# Patient Record
Sex: Male | Born: 1980 | Race: White | Hispanic: No | Marital: Single | Smoking: Current every day smoker
Health system: Southern US, Community
[De-identification: ages and names within clinical notes are randomized; demographics above are authoritative.]

---

## 2005-09-20 ENCOUNTER — Emergency Department: Payer: Self-pay | Admitting: Unknown Physician Specialty

## 2005-09-23 ENCOUNTER — Emergency Department: Payer: Self-pay | Admitting: Emergency Medicine

## 2010-05-26 ENCOUNTER — Emergency Department: Payer: Self-pay | Admitting: Emergency Medicine

## 2010-12-27 ENCOUNTER — Emergency Department: Payer: Self-pay | Admitting: Emergency Medicine

## 2011-05-03 ENCOUNTER — Emergency Department: Payer: Self-pay | Admitting: Emergency Medicine

## 2011-08-02 ENCOUNTER — Inpatient Hospital Stay: Payer: Self-pay | Admitting: Psychiatry

## 2011-08-02 LAB — DRUG SCREEN, URINE
Amphetamines, Ur Screen: NEGATIVE (ref ?–1000)
Barbiturates, Ur Screen: NEGATIVE (ref ?–200)
Cocaine Metabolite,Ur ~~LOC~~: POSITIVE (ref ?–300)
Methadone, Ur Screen: NEGATIVE (ref ?–300)
Opiate, Ur Screen: NEGATIVE (ref ?–300)
Tricyclic, Ur Screen: NEGATIVE (ref ?–1000)

## 2011-08-02 LAB — SALICYLATE LEVEL: Salicylates, Serum: 4.2 mg/dL — ABNORMAL HIGH

## 2011-08-02 LAB — URINALYSIS, COMPLETE
Ketone: NEGATIVE
Leukocyte Esterase: NEGATIVE
Nitrite: NEGATIVE
Ph: 6 (ref 4.5–8.0)
Protein: NEGATIVE
Specific Gravity: 1.002 (ref 1.003–1.030)

## 2011-08-02 LAB — COMPREHENSIVE METABOLIC PANEL
Alkaline Phosphatase: 116 U/L (ref 50–136)
Anion Gap: 13 (ref 7–16)
BUN: 6 mg/dL — ABNORMAL LOW (ref 7–18)
Bilirubin,Total: 0.4 mg/dL (ref 0.2–1.0)
Calcium, Total: 8.9 mg/dL (ref 8.5–10.1)
Chloride: 104 mmol/L (ref 98–107)
Co2: 27 mmol/L (ref 21–32)
Creatinine: 0.85 mg/dL (ref 0.60–1.30)
EGFR (Non-African Amer.): >60
Potassium: 4 mmol/L (ref 3.5–5.1)
SGPT (ALT): 47 U/L
Sodium: 144 mmol/L (ref 136–145)
Total Protein: 8.5 g/dL — ABNORMAL HIGH (ref 6.4–8.2)

## 2011-08-02 LAB — CBC
HGB: 17.4 g/dL (ref 13.0–18.0)
MCH: 31.3 pg (ref 26.0–34.0)
MCHC: 33.7 g/dL (ref 32.0–36.0)
MCV: 93 fL (ref 80–100)
Platelet: 294 10*3/uL (ref 150–440)

## 2011-08-02 LAB — ACETAMINOPHEN LEVEL: Acetaminophen: <2 ug/mL

## 2014-11-09 NOTE — H&P (Signed)
PATIENT NAME:  Gerald Ayala, Gerald Ayala#:  [id number] DATE OF BIRTH:  Feb 07, 1981  DATE OF ADMISSION:  08/02/2011  REFERRING PHYSICIAN: Dr. Daryel November ATTENDING PHYSICIAN: Jolanta B. Jennet Maduro, M.D.   IDENTIFYING DATA: Gerald Ayala is a 34 year old male with history of alcoholism.   CHIEF COMPLAINT: "I need to get better."   HISTORY OF PRESENT ILLNESS: Gerald Ayala was brought to the Emergency Room by his brother after he overdosed on Klonopin, alcohol and cocaine. He has been very adamant about wanting to die but allowed his brother to bring him to the hospital. He experienced several severe stressors lately, many members of his family passed away, some of his friends recently. He also has not been seeing his children. He lost his job, his health insurance and is unable to see a therapist or a psychiatrist any longer. He reports poor sleep, decreased appetite, anhedonia, feelings of guilt, worthlessness, hopelessness, poor energy and concentration, depressed mood, social isolation, and thought of suicide for the past several weeks. He has been drinking heavily lately. He has been using cocaine and other drugs. He denies psychotic symptoms or symptoms suggestive of bipolar mania.   PAST PSYCHIATRIC HISTORY: He had been in treatment taking antidepressant several months ago, however, when his co-pay was increased to $75 a visit he was no longer able to afford to see a psychiatrist. He continued with a therapist for a while but it also has stopped. He has no insurance at the moment so it may be more affordable for him to go to through mental health center channels. He denies prior suicide attempts. No prior hospitalization or substance abuse treatment. He has been drinking most of his life, since the age of 23. He drinks beer mostly. He will require alcohol detox.   FAMILY PSYCHIATRIC HISTORY: There is a long history of mental illness with bipolar, schizophrenia, depression, alcoholism. Two of his  uncles committed suicide by shooting themselves. There are other patients who were hospitalized or were rather unstable even if not diagnosed.    PAST MEDICAL HISTORY: None.   ALLERGIES: No known drug allergies.   MEDICATIONS ON ADMISSION: None.   SOCIAL HISTORY: He is separated from his family and unable to see his two children. He lost his job. He has very a supportive brother who really is more like a father to him as he grew up in an alcoholic father family. His younger brother died recently contributing to his depression.   REVIEW OF SYSTEMS: CONSTITUTIONAL: No fevers or chills. No weight changes. EYES: No double or blurred vision. ENT: No hearing loss. RESPIRATORY: No shortness of breath or cough. CARDIOVASCULAR: No chest pain or orthopnea. GASTROINTESTINAL: No abdominal pain, nausea, vomiting, or diarrhea. GENITOURINARY: No incontinence or frequency. ENDOCRINE: No heat or cold intolerance. LYMPHATIC: No anemia or easy bruising. INTEGUMENTARY: No acne or rash. MUSCULOSKELETAL: No muscle or joint pain. NEUROLOGIC: No tingling or weakness. PSYCHIATRIC: See history of present illness for details.   PHYSICAL EXAMINATION:  VITAL SIGNS: Blood pressure 112/75, pulse 78, respirations 20, temperature 98.4.   GENERAL: This is a well-developed male in no acute distress.   HEENT: The pupils are equal, round, and reactive to light. Sclera anicteric.   NECK: Supple. No thyromegaly.   LUNGS: Clear to auscultation. No dullness to percussion.   HEART: Regular rhythm and rate. No murmurs, rubs, or gallops.   ABDOMEN: Soft, nontender, nondistended. Positive bowel sounds.   MUSCULOSKELETAL: Normal muscle strength in all extremities.   SKIN: No rashes or bruises.  LYMPHATIC: No cervical adenopathy.   NEUROLOGIC: Cranial nerves II through XII are intact.   LABORATORY, DIAGNOSTIC AND RADIOLOGICAL DATA: Chemistries are within normal limits. Blood alcohol level on admission 0.197. LFTs within normal  limits except for AST of 41. TSH 3.17. Urine tox screen positive for cocaine and cannabinoids. CBC within normal limits except for white blood count of 14.8. Urinalysis is not suggestive of urinary tract infection. Serum acetaminophen less than 2. Serum salicylates 4.2. EKG normal sinus rhythm, left axis deviation, abnormal EKG.   MENTAL STATUS EXAMINATION: The patient is somnolent but arousable. He is oriented to person, place, time, and situation. He is pleasant, polite, and cooperative. He is poorly groomed, wearing hospital scrubs and a yellow shirt. He maintains some eye contact. His speech is slow and slightly slurred. His mood is depressed with flat affect. Thought processing is logical and goal oriented. Thought content: He denies suicidal or homicidal ideation but was admitted after a suicide attempt by overdose on alcohol, benzodiazepines and cocaine. He was very oversedated in the Emergency Room and we had to wait several hours for him to wake up. There are no delusions or paranoia. There are no auditory or visual hallucinations. His cognition is grossly intact. He registers three out of three and recalls two out of three objects after three minutes with help. He is unable to spell or count today. His abstraction is preserved. His insight and judgment are poor.   SUICIDE RISK ASSESSMENT ON ADMISSION: This is a patient with history of alcoholism and depression who has family history of multiple completed suicides who was admitted to the hospital after a serious suicide attempt.   ASSESSMENT:  AXIS I:  1. Mood disorder, not otherwise specified.  2. Alcohol dependence.  3. Benzodiazepine dependence.  4. Cocaine dependence.  5. Marijuana abuse.   AXIS II: Deferred.   AXIS III: None.   AXIS IV:  1. Mental illness.  2. Substance abuse.  3. Primary support.  4. Family conflict. 5. Employment. 6. Financial. 7. Access to care.   AXIS V: GAF on admission 25.   PLAN: The patient was  admitted to [facility] Behavioral Medicine unit for safety, stabilization and medication management. He was initially placed on suicide precautions and was closely monitored for any unsafe behaviors. He underwent full psychiatric and risk assessment. He received pharmacotherapy, individual and group psychotherapy, substance abuse counseling, and support from therapeutic milieu.  1. Suicidality. This has resolved. The patient is able to contract for safety. We will discontinue sitter.  2. Alcohol detox. The patient was placed on standard CIWA protocol and will monitor for symptoms of alcohol withdrawal.  3. Substance abuse treatment. The patient is uncertain whether or not he will be able to participate in residential rehab. He lives with a roommate and has to share the cost of housing and bills and needs to check with his family if they will help him while he is in treatment.  4. Disposition. To be established.  ____________________________ Ellin GoodieJolanta B. Jennet MaduroPucilowska, MD jbp:cms D: 08/03/2011 11:36:38 ET T: 08/03/2011 11:56:29 ET JOB#: [id number]  cc: Jolanta B. Jennet MaduroPucilowska, MD, <Dictator> Shari ProwsJOLANTA B PUCILOWSKA MD ELECTRONICALLY SIGNED 08/09/2011 23:33

## 2017-04-21 ENCOUNTER — Encounter: Payer: Self-pay | Admitting: Emergency Medicine

## 2017-04-21 ENCOUNTER — Emergency Department
Admission: EM | Admit: 2017-04-21 | Discharge: 2017-04-21 | Disposition: A | Discharge status: Home / Self Care | Payer: Self-pay | Attending: Emergency Medicine | Admitting: Emergency Medicine

## 2017-04-21 DIAGNOSIS — Y999 Unspecified external cause status: Secondary | ICD-10-CM | POA: Insufficient documentation

## 2017-04-21 DIAGNOSIS — S39012A Strain of muscle, fascia and tendon of lower back, initial encounter: Secondary | ICD-10-CM | POA: Insufficient documentation

## 2017-04-21 DIAGNOSIS — Y929 Unspecified place or not applicable: Secondary | ICD-10-CM | POA: Insufficient documentation

## 2017-04-21 DIAGNOSIS — M62830 Muscle spasm of back: Secondary | ICD-10-CM | POA: Insufficient documentation

## 2017-04-21 DIAGNOSIS — X58XXXA Exposure to other specified factors, initial encounter: Secondary | ICD-10-CM | POA: Insufficient documentation

## 2017-04-21 DIAGNOSIS — F17210 Nicotine dependence, cigarettes, uncomplicated: Secondary | ICD-10-CM | POA: Insufficient documentation

## 2017-04-21 DIAGNOSIS — M5431 Sciatica, right side: Secondary | ICD-10-CM | POA: Insufficient documentation

## 2017-04-21 DIAGNOSIS — Y939 Activity, unspecified: Secondary | ICD-10-CM | POA: Insufficient documentation

## 2017-04-21 MED ORDER — DICLOFENAC SODIUM 75 MG PO TBEC: 75.0000 mg | DELAYED_RELEASE_TABLET | Freq: Two times a day (BID) | ORAL | 0 refills | Status: AC

## 2017-04-21 MED ORDER — ORPHENADRINE CITRATE 30 MG/ML IJ SOLN
60.0000 mg | INTRAMUSCULAR | Status: AC
  Administered 2017-04-21: 60 mg via INTRAMUSCULAR
  Filled 2017-04-21: qty 2

## 2017-04-21 MED ORDER — KETOROLAC TROMETHAMINE 60 MG/2ML IM SOLN
30.0000 mg | Freq: Once | INTRAMUSCULAR | Status: AC
  Administered 2017-04-21: 30 mg via INTRAMUSCULAR
  Filled 2017-04-21: qty 2

## 2017-04-21 MED ORDER — CYCLOBENZAPRINE HCL 5 MG PO TABS: 5.0000 mg | ORAL_TABLET | Freq: Three times a day (TID) | ORAL | 0 refills | Status: DC | PRN

## 2017-04-21 NOTE — ED Triage Notes (Signed)
Pt reports right lower back pain that radiates down the back of his right leg; denies injury; taking Ibuprofen and Tylenol with no relief; pain worse with any movement

## 2017-04-21 NOTE — Discharge Instructions (Signed)
Your exam is consistent with a lumbar strain with some sciatic nerve irritation. Take the prescription meds as directed. Follow-up with [facility] for continued symptoms.

## 2017-04-22 NOTE — ED Provider Notes (Signed)
[facility] Emergency Department Provider Note ____________________________________________  Time seen: 2029  I have reviewed the triage vital signs and the nursing notes.  HISTORY  Chief Complaint  Back Pain  HPI Gerald Ayala is a 36 y.o. male Presents to the ED for evaluation of right low back pain with some radiation into the left buttocks and thigh. He describes symptoms began about 3-4 days prior. He denies any particular injury, accident, or,. He works as a Public affairs consultant, but denies any strenuous activities this week. He describes pain to the right lumbar sacral junction with referral into the buttocks and thigh. He denies any referral beyond the knee. He also denies any distal paresthesias, foot drop, or incontinence. He scratched pain is aggravated by transitioning from sit to stand. He denies any history of previous back injury, strain, or arthritis. He also denies any prior history of sciatic nerve irritation. He has taken Tylenol and ibuprofen in the past without any significant benefit.  History reviewed. No pertinent past medical history.  There are no active problems to display for this patient.  History reviewed. No pertinent surgical history.  Prior to Admission medications   Medication Sig Start Date End Date Taking? Authorizing Provider  cyclobenzaprine (FLEXERIL) 5 MG tablet Take 1 tablet (5 mg total) by mouth 3 (three) times daily as needed for muscle spasms. 04/21/17   Mishael Haran, Charlesetta Ivory, PA-C  diclofenac (VOLTAREN) 75 MG EC tablet Take 1 tablet (75 mg total) by mouth 2 (two) times daily. 04/21/17 05/06/17  Virlan Kempker, Charlesetta Ivory, PA-C    Allergies Patient has no known allergies.  History reviewed. No pertinent family history.  Social History Social History  Substance Use Topics  . Smoking status: Current Every Day Smoker    Packs/day: 0.50    Types: Cigarettes  . Smokeless tobacco: Never Used  . Alcohol  use Yes    Review of Systems  Constitutional: Negative for fever. Cardiovascular: Negative for chest pain. Respiratory: Negative for shortness of breath. Gastrointestinal: Negative for abdominal pain, vomiting and diarrhea. Genitourinary: Negative for dysuria. Musculoskeletal: Positive for back pain. Skin: Negative for rash. Neurological: Negative for headaches, focal weakness or numbness. ____________________________________________  PHYSICAL EXAM:  VITAL SIGNS: ED Triage Vitals  Enc Vitals Group     BP 04/21/17 1933 137/90     Pulse Rate 04/21/17 1933 84     Resp 04/21/17 1933 18     Temp 04/21/17 1933 98.3 F (36.8 C)     Temp Source 04/21/17 1933 Oral     SpO2 04/21/17 1933 98 %     Weight 04/21/17 1934 215 lb (97.5 kg)     Height 04/21/17 1934  (1.88 m)     Head Circumference --      Peak Flow --      Pain Score 04/21/17 1933 8     Pain Loc --      Pain Edu? --      Excl. in GC? --     Constitutional: Alert and oriented. Well appearing and in no distress. Head: Normocephalic and atraumatic. Eyes: Conjunctivae are normal. Normal extraocular movements Cardiovascular: Normal rate, regular rhythm. Normal distal pulses. Respiratory: Normal respiratory effort. No wheezes/rales/rhonchi. Gastrointestinal: Soft and nontender. No distention. Musculoskeletal: normal spinal alignment without midline tenderness, spasm, deformity, step-off. Patient with mild tenderness to palpation over the right lumbar sacral junction. He is able to transition from sit to stand without difficulty. Is able to demonstrate normal lumbar extension  but decreased lumbar flexion. He has the ability to raise the hips and legs bilaterally without difficulty. Negative supine straight leg raise. Nontender with normal range of motion in all extremities.  Neurologic:  Mildly antalgic gait without ataxia. Normal LE DTRs bilaterally. Normal toe dorsiflexion and foot eversion bilaterally. Normal speech and  language. No gross focal neurologic deficits are appreciated. Skin:  Skin is warm, dry and intact. No rash noted. ____________________________________________   RADIOLOGY  Deferred. ____________________________________________  PROCEDURES  Toradol 30 mg IM Norflex 60 mg IM ____________________________________________  INITIAL IMPRESSION / ASSESSMENT AND PLAN / ED COURSE  atient with the ED evaluation of acute lumbar strain with some mild right sciatic nerve irritation.Patient reports visiting improvement following injection medication administration in the ED. He is subsequently discharged with prescriptions for cyclobenzaprine and diclofenac. He should follow-up with [facility] or [facility] for ongoing symptom management. Return precautions are reviewed.  ____________________________________________  FINAL CLINICAL IMPRESSION(S) / ED DIAGNOSES  Final diagnoses:  Strain of lumbar region, initial encounter  Muscle spasm of back  Sciatica of right side      Karmen Stabs, Charlesetta Ivory, PA-C 04/22/17 0017    Myrna Blazer, MD 04/22/17 [phone]

## 2018-03-06 ENCOUNTER — Emergency Department
Admission: EM | Admit: 2018-03-06 | Discharge: 2018-03-06 | Disposition: A | Discharge status: Home / Self Care | Payer: Self-pay | Attending: Emergency Medicine | Admitting: Emergency Medicine

## 2018-03-06 ENCOUNTER — Encounter: Payer: Self-pay | Admitting: Emergency Medicine

## 2018-03-06 DIAGNOSIS — F17210 Nicotine dependence, cigarettes, uncomplicated: Secondary | ICD-10-CM | POA: Insufficient documentation

## 2018-03-06 DIAGNOSIS — Y93H3 Activity, building and construction: Secondary | ICD-10-CM | POA: Insufficient documentation

## 2018-03-06 DIAGNOSIS — M7021 Olecranon bursitis, right elbow: Secondary | ICD-10-CM | POA: Insufficient documentation

## 2018-03-06 MED ORDER — NAPROXEN 500 MG PO TABS: 500.0000 mg | ORAL_TABLET | Freq: Two times a day (BID) | ORAL | Status: DC

## 2018-03-06 MED ORDER — NAPROXEN 500 MG PO TABS
500.0000 mg | ORAL_TABLET | Freq: Once | ORAL | Status: AC
  Administered 2018-03-06: 500 mg via ORAL
  Filled 2018-03-06: qty 1

## 2018-03-06 NOTE — ED Provider Notes (Signed)
[facility] Emergency Department Provider Note   ____________________________________________   First MD Initiated Contact with Patient 03/06/18 1424     (approximate)  I have reviewed the triage vital signs and the nursing notes.   HISTORY  Chief Complaint Joint Swelling    HPI Gerald Ayala is a 37 y.o. male patient presents with 2 days of swelling and pain to right posterior elbow.  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  Patient rates his pain as a 6/10.  Patient described the pain is "aching".  Patient did pain increased with extension of the elbow.  No palliative measure for complaint.  History reviewed. No pertinent past medical history.  There are no active problems to display for this patient.   History reviewed. No pertinent surgical history.  Prior to Admission medications   Medication Sig Start Date End Date Taking? Authorizing Provider  cyclobenzaprine (FLEXERIL) 5 MG tablet Take 1 tablet (5 mg total) by mouth 3 (three) times daily as needed for muscle spasms. 04/21/17   Menshew, Charlesetta IvoryJenise V Bacon, PA-C  naproxen (NAPROSYN) 500 MG tablet Take 1 tablet (500 mg total) by mouth 2 (two) times daily with a meal. 03/06/18   Joni ReiningSmith, Ronald K, PA-C    Allergies Patient has no known allergies.  No family history on file.  Social History Social History   Tobacco Use  . Smoking status: Current Every Day Smoker    Packs/day: 0.50    Types: Cigarettes  . Smokeless tobacco: Never Used  Substance Use Topics  . Alcohol use: Yes  . Drug use: No    Review of Systems Constitutional: No fever/chills Eyes: No visual changes. ENT: No sore throat. Cardiovascular: Denies chest pain. Respiratory: Denies shortness of breath. Gastrointestinal: No abdominal pain.  No nausea, no vomiting.  No diarrhea.  No constipation. Genitourinary: Negative for dysuria. Musculoskeletal: Negative for back  pain. Skin: Edema posterior right elbow.  No erythema.   Neurological: Negative for headaches, focal weakness or numbness.   ____________________________________________   PHYSICAL EXAM:  VITAL SIGNS: ED Triage Vitals [03/06/18 1412]  Enc Vitals Group     BP (!) 141/73     Pulse Rate 90     Resp 20     Temp 99 F (37.2 C)     Temp Source Oral     SpO2 99 %     Weight 205 lb (93 kg)     Height 6\' 2"  (1.88 m)     Head Circumference      Peak Flow      Pain Score 6     Pain Loc      Pain Edu?      Excl. in GC?    Constitutional: Alert and oriented. Well appearing and in no acute distress.  Afebrile. Cardiovascular: Normal rate, regular rhythm. Grossly normal heart sounds.  Good peripheral circulation. Respiratory: Normal respiratory effort.  No retractions. Lungs CTAB. Gastrointestinal: Soft and nontender. No distention. No abdominal bruits. No CVA tenderness. Musculoskeletal: No obvious deformity to right elbow.   Neurologic:  Normal speech and language. No gross focal neurologic deficits are appreciated. No gait instability. Skin:  Skin is warm, dry and intact. No rash noted.  Edema without erythema. Psychiatric: Mood and affect are normal. Speech and behavior are normal.  ____________________________________________   LABS (all labs ordered are listed, but only abnormal results are displayed)  Labs Reviewed - No data to display ____________________________________________  EKG  ____________________________________________  RADIOLOGY  ED MD interpretation:    Official radiology report(s): No results found.  ____________________________________________   PROCEDURES  Procedure(s) performed: None  .Joint Aspiration/Arthrocentesis Date/Time: 03/06/2018 2:54 PM Performed by: Joni ReiningSmith, Ronald K, PA-C Authorized by: Joni ReiningSmith, Ronald K, PA-C   Consent:    Consent obtained:  Verbal   Consent given by:  Patient   Risks discussed:  Bleeding, infection, pain and  incomplete drainage Location:    Location:  Elbow   Elbow:  R elbow Anesthesia (see MAR for exact dosages):    Anesthesia method:  Local infiltration   Local anesthetic:  Lidocaine 1% WITH epi Procedure details:    Preparation: Patient was prepped and draped in usual sterile fashion     Needle gauge:  18 G   Ultrasound guidance: no     Approach:  Inferior   Aspirate characteristics:  Yellow   Steroid injected: no     Specimen collected: no   Post-procedure details:    Dressing:  Sterile dressing   Patient tolerance of procedure:  Tolerated well, no immediate complications    Critical Care performed: No  ____________________________________________   INITIAL IMPRESSION / ASSESSMENT AND PLAN / ED COURSE  As part of my medical decision making, I reviewed the following data within the electronic MEDICAL RECORD NUMBER    Edema posterior left elbow secondary to olecranon bursitis.  Patient gave verbal consent to have the lesion aspirated.  Patient given discharge care instruction in a prescription for naproxen.  Patient return back in 3 days for reevaluation.  Advised patient return back earlier if increased swelling or pain.      ____________________________________________   FINAL CLINICAL IMPRESSION(S) / ED DIAGNOSES  Final diagnoses:  Olecranon bursitis of right elbow     ED Discharge Orders         Ordered    naproxen (NAPROSYN) 500 MG tablet  2 times daily with meals     03/06/18 1445           Note:  This document was prepared using Dragon voice recognition software and may include unintentional dictation errors.    Joni ReiningSmith, Ronald K, PA-C 03/06/18 1459    Minna AntisPaduchowski, Kevin, MD 03/06/18 [phone]

## 2018-03-06 NOTE — Discharge Instructions (Signed)
Follow discharge care instructions and take medication as directed.  Return back in 3 days for reevaluation.

## 2018-03-06 NOTE — ED Triage Notes (Signed)
Pt reports last 2 days has had swelling to right elbow and pain. Denies obvious injuries.

## 2018-04-15 ENCOUNTER — Emergency Department: Payer: Self-pay

## 2018-04-15 ENCOUNTER — Other Ambulatory Visit: Payer: Self-pay

## 2018-04-15 ENCOUNTER — Emergency Department
Admission: EM | Admit: 2018-04-15 | Discharge: 2018-04-15 | Disposition: A | Discharge status: Home / Self Care | Payer: Self-pay | Attending: Emergency Medicine | Admitting: Emergency Medicine

## 2018-04-15 DIAGNOSIS — S01511A Laceration without foreign body of lip, initial encounter: Secondary | ICD-10-CM | POA: Insufficient documentation

## 2018-04-15 DIAGNOSIS — W500XXA Accidental hit or strike by another person, initial encounter: Secondary | ICD-10-CM | POA: Insufficient documentation

## 2018-04-15 DIAGNOSIS — Y998 Other external cause status: Secondary | ICD-10-CM | POA: Insufficient documentation

## 2018-04-15 DIAGNOSIS — Y929 Unspecified place or not applicable: Secondary | ICD-10-CM | POA: Insufficient documentation

## 2018-04-15 DIAGNOSIS — F17210 Nicotine dependence, cigarettes, uncomplicated: Secondary | ICD-10-CM | POA: Insufficient documentation

## 2018-04-15 DIAGNOSIS — Y9389 Activity, other specified: Secondary | ICD-10-CM | POA: Insufficient documentation

## 2018-04-15 DIAGNOSIS — Z79899 Other long term (current) drug therapy: Secondary | ICD-10-CM | POA: Insufficient documentation

## 2018-04-15 MED ORDER — LIDOCAINE HCL (PF) 1 % IJ SOLN
INTRAMUSCULAR | Status: AC
  Filled 2018-04-15: qty 5

## 2018-04-15 MED ORDER — LIDOCAINE HCL 1 % IJ SOLN: 5.0000 mL | Freq: Once | INTRAMUSCULAR | Status: DC

## 2018-04-15 MED ORDER — MELOXICAM 15 MG PO TABS: 15.0000 mg | ORAL_TABLET | Freq: Every day | ORAL | 1 refills | Status: AC

## 2018-04-15 MED ORDER — CEPHALEXIN 500 MG PO CAPS: 500.0000 mg | ORAL_CAPSULE | Freq: Three times a day (TID) | ORAL | 0 refills | Status: AC

## 2018-04-15 NOTE — ED Notes (Signed)
Pt offered to have his blood  Cleaned
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
## 2018-04-15 NOTE — ED Notes (Signed)
Patient transported to CT 

## 2019-05-16 ENCOUNTER — Emergency Department
Admission: EM | Admit: 2019-05-16 | Discharge: 2019-05-16 | Disposition: A | Discharge status: Home / Self Care | Payer: Self-pay | Attending: Emergency Medicine | Admitting: Emergency Medicine

## 2019-05-16 ENCOUNTER — Other Ambulatory Visit: Payer: Self-pay

## 2019-05-16 ENCOUNTER — Emergency Department: Payer: Self-pay

## 2019-05-16 ENCOUNTER — Encounter: Payer: Self-pay | Admitting: Emergency Medicine

## 2019-05-16 DIAGNOSIS — F17210 Nicotine dependence, cigarettes, uncomplicated: Secondary | ICD-10-CM | POA: Insufficient documentation

## 2019-05-16 DIAGNOSIS — R079 Chest pain, unspecified: Secondary | ICD-10-CM

## 2019-05-16 LAB — CBC
HCT: 46.1 % (ref 39.0–52.0)
Hemoglobin: 15.7 g/dL (ref 13.0–17.0)
MCH: 31.6 pg (ref 26.0–34.0)
MCHC: 34.1 g/dL (ref 30.0–36.0)
MCV: 92.8 fL (ref 80.0–100.0)
Platelets: 326 10*3/uL (ref 150–400)
RBC: 4.97 MIL/uL (ref 4.22–5.81)
RDW: 12.6 % (ref 11.5–15.5)
WBC: 20.4 10*3/uL — ABNORMAL HIGH (ref 4.0–10.5)
nRBC: 0 % (ref 0.0–0.2)

## 2019-05-16 LAB — BASIC METABOLIC PANEL
Anion gap: 13 (ref 5–15)
BUN: 12 mg/dL (ref 6–20)
CO2: 22 mmol/L (ref 22–32)
Calcium: 9.6 mg/dL (ref 8.9–10.3)
Chloride: 103 mmol/L (ref 98–111)
Creatinine, Ser: 0.76 mg/dL (ref 0.61–1.24)
GFR calc Af Amer: >60 mL/min (ref >60)
GFR calc non Af Amer: >60 mL/min (ref >60)
Glucose, Bld: 111 mg/dL — ABNORMAL HIGH (ref 70–99)
Potassium: 4.3 mmol/L (ref 3.5–5.1)
Sodium: 138 mmol/L (ref 135–145)

## 2019-05-16 LAB — TROPONIN I (HIGH SENSITIVITY)
Troponin I (High Sensitivity): 4 ng/L (ref <18)
Troponin I (High Sensitivity): 5 ng/L (ref <18)

## 2019-05-16 MED ORDER — DIAZEPAM 5 MG PO TABS: 5.0000 mg | ORAL_TABLET | Freq: Three times a day (TID) | ORAL | 0 refills | Status: DC | PRN

## 2019-05-16 MED ORDER — IOHEXOL 350 MG/ML SOLN
100.0000 mL | Freq: Once | INTRAVENOUS | Status: AC | PRN
  Administered 2019-05-16: 100 mL via INTRAVENOUS

## 2019-05-16 MED ORDER — FAMOTIDINE 20 MG PO TABS: 20.0000 mg | ORAL_TABLET | Freq: Two times a day (BID) | ORAL | 1 refills | Status: AC

## 2019-05-16 MED ORDER — HYDROMORPHONE HCL 1 MG/ML IJ SOLN
1.0000 mg | Freq: Once | INTRAMUSCULAR | Status: AC
  Administered 2019-05-16: 1 mg via INTRAVENOUS
  Filled 2019-05-16: qty 1

## 2019-05-16 MED ORDER — SODIUM CHLORIDE 0.9% FLUSH: 3.0000 mL | Freq: Once | INTRAVENOUS | Status: DC

## 2019-05-16 NOTE — ED Notes (Signed)

## 2019-05-16 NOTE — ED Provider Notes (Signed)
[facility] Emergency Department Provider Note       Time seen: ----------------------------------------- 11:47 AM on 05/16/2019 -----------------------------------------   I have reviewed the triage vital signs and the nursing notes.  HISTORY   Chief Complaint Chest Pain    HPI Gerald Ayala is a 38 y.o. male with no known past medical history who presents to the ED for bilateral shoulder pain that radiates to the center of his chest and into his back.  Patient states his brother died of a cardiac event at the age of 61.  He has had some sweats, denies nausea, vomiting, fever.  He has recently had a cough.  Pain is 8 out of 10 in his chest  History reviewed. No pertinent past medical history.  There are no active problems to display for this patient.   History reviewed. No pertinent surgical history.  Allergies Patient has no known allergies.  Social History Social History   Tobacco Use  . Smoking status: Current Every Day Smoker    Packs/day: 1.00    Types: Cigarettes  . Smokeless tobacco: Never Used  Substance Use Topics  . Alcohol use: Yes  . Drug use: No    Review of Systems Constitutional: Negative for fever. Cardiovascular: Positive for chest pain Respiratory: Negative for shortness of breath. Gastrointestinal: Negative for abdominal pain, vomiting and diarrhea. Musculoskeletal: Positive for back pain Skin: Negative for rash. Neurological: Negative for headaches, focal weakness or numbness.  All systems negative/normal/unremarkable except as stated in the HPI  ____________________________________________   PHYSICAL EXAM:  VITAL SIGNS: ED Triage Vitals  Enc Vitals Group     BP 05/16/19 1014 137/84     Pulse Rate 05/16/19 1014 91     Resp 05/16/19 1014 16     Temp 05/16/19 1014 98.7 F (37.1 C)     Temp Source 05/16/19 1014 Oral     SpO2 05/16/19 1014 100 %     Weight 05/16/19 1015 204 lb (92.5 kg)     Height  05/16/19 1015 6\' 3"  (1.905 m)     Head Circumference --      Peak Flow --      Pain Score 05/16/19 1015 8     Pain Loc --      Pain Edu? --      Excl. in GC? --    Constitutional: Alert and oriented. Well appearing and in no distress. Eyes: Conjunctivae are normal. Normal extraocular movements. ENT      Head: Normocephalic and atraumatic.      Nose: No congestion/rhinnorhea.      Mouth/Throat: Mucous membranes are moist.      Neck: No stridor. Cardiovascular: Normal rate, regular rhythm. No murmurs, rubs, or gallops. Respiratory: Normal respiratory effort without tachypnea nor retractions. Breath sounds are clear and equal bilaterally. No wheezes/rales/rhonchi. Gastrointestinal: Soft and nontender. Normal bowel sounds Musculoskeletal: Nontender with normal range of motion in extremities. No lower extremity tenderness nor edema. Neurologic:  Normal speech and language. No gross focal neurologic deficits are appreciated.  Skin:  Skin is warm, dry and intact. No rash noted. Psychiatric: Mood and affect are normal. Speech and behavior are normal.  ____________________________________________  EKG: Interpreted by me.  Sinus rhythm with a rate of 97 bpm, normal PR interval, normal QRS, normal QT  Repeat EKG interpreted by me, sinus rhythm the rate of 79 bpm, low voltage, ST elevation minimally noted, particularly in the inferior leads.  No reciprocal changes are noted  ____________________________________________  ED COURSE:  As part of my medical decision making, I reviewed the following data within the [place] History obtained from family if available, nursing notes, old chart and ekg, as well as notes from prior ED visits. Patient presented for chest pain, we will assess with labs and imaging as indicated at this time.   Procedures  Therisa Doyne Granillo was evaluated in Emergency Department on 05/16/2019 for the symptoms described in the history of present illness.  He was evaluated in the context of the global COVID-19 pandemic, which necessitated consideration that the patient might be at risk for infection with the SARS-CoV-2 virus that causes COVID-19. Institutional protocols and algorithms that pertain to the evaluation of patients at risk for COVID-19 are in a state of rapid change based on information released by regulatory bodies including the CDC and federal and state organizations. These policies and algorithms were followed during the patient's care in the ED.  ____________________________________________   LABS (pertinent positives/negatives)  Labs Reviewed  BASIC METABOLIC PANEL - Abnormal; Notable for the following components:      Result Value   Glucose, Bld 111 (*)    All other components within normal limits  CBC - Abnormal; Notable for the following components:   WBC 20.4 (*)    All other components within normal limits  TROPONIN I (HIGH SENSITIVITY)  TROPONIN I (HIGH SENSITIVITY)    RADIOLOGY Images were viewed by me  Chest x-ray is unremarkable CTA dissection protocol IMPRESSION:  CT angiogram chest:   1. No thoracic aortic aneurysm or dissection.   2. No evident pulmonary embolus.   3. Mild bibasilar atelectasis. No parenchymal lung edema or  consolidation.   4. No evident thoracic adenopathy.   CT angiogram abdomen; CT angiogram pelvis:   1. Aorta, major mesenteric, and major pelvic arterial vessels are  patent without evidence of obstructive plaque. No aneurysm or  dissection involving these vessels. No evident fibromuscular  dysplasia.   2. Hepatic steatosis.   3. No bowel obstruction. No abscess in the abdomen or pelvis.  Appendix appears normal.   4. No renal or ureteral calculus on either side. No hydronephrosis.  Urinary bladder wall thickness normal.   5. Apparent lipoma in the right quadratus lumborum muscle.  ____________________________________________   DIFFERENTIAL DIAGNOSIS    Musculoskeletal pain, GERD, anxiety, MI, PE, dissection  FINAL ASSESSMENT AND PLAN  Chest pain, leukocytosis   Plan: The patient had presented for nonspecific chest pain. Patient's labs did indicate leukocytosis which may be chronic. Patient's imaging initially was negative on chest x-ray and CT angiogram dissection protocol was unremarkable.  No clear etiology for his symptoms.  He will be discharged with close outpatient follow-up.   Laurence Aly, MD    Note: This note was generated in part or whole with voice recognition software. Voice recognition is usually quite accurate but there are transcription errors that can and very often do occur. I apologize for any typographical errors that were not detected and corrected.     Earleen Newport, MD 05/16/19 [phone]
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
## 2019-07-01 ENCOUNTER — Emergency Department
Admission: EM | Admit: 2019-07-01 | Discharge: 2019-07-01 | Disposition: A | Discharge status: Home / Self Care | Payer: Self-pay | Attending: Emergency Medicine | Admitting: Emergency Medicine

## 2019-07-01 ENCOUNTER — Other Ambulatory Visit: Payer: Self-pay

## 2019-07-01 ENCOUNTER — Encounter: Payer: Self-pay | Admitting: Emergency Medicine

## 2019-07-01 ENCOUNTER — Emergency Department: Payer: Self-pay

## 2019-07-01 DIAGNOSIS — W500XXA Accidental hit or strike by another person, initial encounter: Secondary | ICD-10-CM | POA: Insufficient documentation

## 2019-07-01 DIAGNOSIS — S2231XA Fracture of one rib, right side, initial encounter for closed fracture: Secondary | ICD-10-CM | POA: Insufficient documentation

## 2019-07-01 DIAGNOSIS — Y9372 Activity, wrestling: Secondary | ICD-10-CM | POA: Insufficient documentation

## 2019-07-01 DIAGNOSIS — Y929 Unspecified place or not applicable: Secondary | ICD-10-CM | POA: Insufficient documentation

## 2019-07-01 DIAGNOSIS — F17210 Nicotine dependence, cigarettes, uncomplicated: Secondary | ICD-10-CM | POA: Insufficient documentation

## 2019-07-01 DIAGNOSIS — Y999 Unspecified external cause status: Secondary | ICD-10-CM | POA: Insufficient documentation

## 2019-07-01 LAB — CBC
HCT: 42.8 % (ref 39.0–52.0)
Hemoglobin: 14.4 g/dL (ref 13.0–17.0)
MCH: 31.4 pg (ref 26.0–34.0)
MCHC: 33.6 g/dL (ref 30.0–36.0)
MCV: 93.2 fL (ref 80.0–100.0)
Platelets: 321 10*3/uL (ref 150–400)
RBC: 4.59 MIL/uL (ref 4.22–5.81)
RDW: 12.3 % (ref 11.5–15.5)
WBC: 14.5 10*3/uL — ABNORMAL HIGH (ref 4.0–10.5)
nRBC: 0 % (ref 0.0–0.2)

## 2019-07-01 LAB — COMPREHENSIVE METABOLIC PANEL
ALT: 25 U/L (ref 0–44)
AST: 28 U/L (ref 15–41)
Albumin: 4.1 g/dL (ref 3.5–5.0)
Alkaline Phosphatase: 72 U/L (ref 38–126)
Anion gap: 10 (ref 5–15)
BUN: 10 mg/dL (ref 6–20)
CO2: 25 mmol/L (ref 22–32)
Calcium: 9 mg/dL (ref 8.9–10.3)
Chloride: 105 mmol/L (ref 98–111)
Creatinine, Ser: 0.66 mg/dL (ref 0.61–1.24)
GFR calc Af Amer: >60 mL/min (ref >60)
GFR calc non Af Amer: >60 mL/min (ref >60)
Glucose, Bld: 106 mg/dL — ABNORMAL HIGH (ref 70–99)
Potassium: 3.9 mmol/L (ref 3.5–5.1)
Sodium: 140 mmol/L (ref 135–145)
Total Bilirubin: 0.9 mg/dL (ref 0.3–1.2)
Total Protein: 7.4 g/dL (ref 6.5–8.1)

## 2019-07-01 MED ORDER — OXYCODONE-ACETAMINOPHEN 5-325 MG PO TABS: 1.0000 | ORAL_TABLET | ORAL | 0 refills | Status: DC | PRN

## 2019-07-01 MED ORDER — KETOROLAC TROMETHAMINE 30 MG/ML IJ SOLN
30.0000 mg | Freq: Once | INTRAMUSCULAR | Status: AC
  Administered 2019-07-01: 30 mg via INTRAMUSCULAR
  Filled 2019-07-01: qty 1

## 2019-07-01 MED ORDER — NAPROXEN 500 MG PO TABS: 500.0000 mg | ORAL_TABLET | Freq: Two times a day (BID) | ORAL | 2 refills | Status: DC

## 2019-07-01 MED ORDER — OXYCODONE-ACETAMINOPHEN 5-325 MG PO TABS
2.0000 | ORAL_TABLET | Freq: Once | ORAL | Status: AC
  Administered 2019-07-01: 2 via ORAL
  Filled 2019-07-01: qty 2

## 2019-07-01 NOTE — ED Notes (Signed)
See triage note  Presents with pain to right posterior rib area  States he developed pain after getting kneed by son while wrestling

## 2019-07-01 NOTE — ED Notes (Signed)
Pt taken to xray 
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
## 2019-07-01 NOTE — ED Notes (Signed)
Pt back from x-ray.
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
## 2020-03-08 ENCOUNTER — Emergency Department
Admission: EM | Admit: 2020-03-08 | Discharge: 2020-03-08 | Disposition: A | Discharge status: Home / Self Care | Payer: Self-pay | Attending: Emergency Medicine | Admitting: Emergency Medicine

## 2020-03-08 ENCOUNTER — Other Ambulatory Visit: Payer: Self-pay

## 2020-03-08 ENCOUNTER — Emergency Department: Payer: Self-pay

## 2020-03-08 DIAGNOSIS — F17210 Nicotine dependence, cigarettes, uncomplicated: Secondary | ICD-10-CM | POA: Insufficient documentation

## 2020-03-08 DIAGNOSIS — M545 Low back pain: Secondary | ICD-10-CM | POA: Insufficient documentation

## 2020-03-08 DIAGNOSIS — M549 Dorsalgia, unspecified: Secondary | ICD-10-CM

## 2020-03-08 DIAGNOSIS — M62830 Muscle spasm of back: Secondary | ICD-10-CM | POA: Insufficient documentation

## 2020-03-08 LAB — URINALYSIS, COMPLETE (UACMP) WITH MICROSCOPIC
Bacteria, UA: NONE SEEN
Bilirubin Urine: NEGATIVE
Glucose, UA: NEGATIVE mg/dL
Ketones, ur: NEGATIVE mg/dL
Nitrite: NEGATIVE
Protein, ur: NEGATIVE mg/dL
Specific Gravity, Urine: 1.017 (ref 1.005–1.030)
Squamous Epithelial / HPF: NONE SEEN (ref 0–5)
pH: 5 (ref 5.0–8.0)

## 2020-03-08 LAB — COMPREHENSIVE METABOLIC PANEL
ALT: 23 U/L (ref 0–44)
AST: 22 U/L (ref 15–41)
Albumin: 4.3 g/dL (ref 3.5–5.0)
Alkaline Phosphatase: 77 U/L (ref 38–126)
Anion gap: 13 (ref 5–15)
BUN: 9 mg/dL (ref 6–20)
CO2: 25 mmol/L (ref 22–32)
Calcium: 9.2 mg/dL (ref 8.9–10.3)
Chloride: 101 mmol/L (ref 98–111)
Creatinine, Ser: 0.69 mg/dL (ref 0.61–1.24)
GFR calc Af Amer: >60 mL/min (ref >60)
GFR calc non Af Amer: >60 mL/min (ref >60)
Glucose, Bld: 90 mg/dL (ref 70–99)
Potassium: 3.4 mmol/L — ABNORMAL LOW (ref 3.5–5.1)
Sodium: 139 mmol/L (ref 135–145)
Total Bilirubin: 0.9 mg/dL (ref 0.3–1.2)
Total Protein: 7.5 g/dL (ref 6.5–8.1)

## 2020-03-08 LAB — CBC
HCT: 42.5 % (ref 39.0–52.0)
Hemoglobin: 14.4 g/dL (ref 13.0–17.0)
MCH: 31.6 pg (ref 26.0–34.0)
MCHC: 33.9 g/dL (ref 30.0–36.0)
MCV: 93.4 fL (ref 80.0–100.0)
Platelets: 301 10*3/uL (ref 150–400)
RBC: 4.55 MIL/uL (ref 4.22–5.81)
RDW: 12.7 % (ref 11.5–15.5)
WBC: 13.2 10*3/uL — ABNORMAL HIGH (ref 4.0–10.5)
nRBC: 0 % (ref 0.0–0.2)

## 2020-03-08 LAB — CHLAMYDIA/NGC RT PCR (ARMC ONLY)
Chlamydia Tr: NOT DETECTED
N gonorrhoeae: NOT DETECTED

## 2020-03-08 MED ORDER — LIDOCAINE 5 % EX PTCH: 1.0000 | MEDICATED_PATCH | CUTANEOUS | 0 refills | Status: DC

## 2020-03-08 MED ORDER — LIDOCAINE 5 % EX PTCH
1.0000 | MEDICATED_PATCH | CUTANEOUS | Status: DC
  Administered 2020-03-08: 1 via TRANSDERMAL
  Filled 2020-03-08: qty 1

## 2020-03-08 MED ORDER — ORPHENADRINE CITRATE 30 MG/ML IJ SOLN
60.0000 mg | Freq: Two times a day (BID) | INTRAMUSCULAR | Status: DC
  Administered 2020-03-08: 60 mg via INTRAVENOUS
  Filled 2020-03-08: qty 2

## 2020-03-08 MED ORDER — KETOROLAC TROMETHAMINE 30 MG/ML IJ SOLN
30.0000 mg | Freq: Once | INTRAMUSCULAR | Status: AC
  Administered 2020-03-08: 30 mg via INTRAVENOUS
  Filled 2020-03-08: qty 1

## 2020-03-08 MED ORDER — OXYCODONE-ACETAMINOPHEN 5-325 MG PO TABS
1.0000 | ORAL_TABLET | ORAL | Status: DC | PRN
  Administered 2020-03-08: 1 via ORAL
  Filled 2020-03-08: qty 1

## 2020-03-08 MED ORDER — OXYCODONE-ACETAMINOPHEN 5-325 MG PO TABS
1.0000 | ORAL_TABLET | Freq: Four times a day (QID) | ORAL | 0 refills | Status: AC | PRN
Start: 2020-03-08 — End: 2020-03-11

## 2020-03-08 MED ORDER — MORPHINE SULFATE (PF) 4 MG/ML IV SOLN
4.0000 mg | Freq: Once | INTRAVENOUS | Status: AC
  Administered 2020-03-08: 4 mg via INTRAVENOUS
  Filled 2020-03-08: qty 1

## 2020-03-08 MED ORDER — IBUPROFEN 600 MG PO TABS: 600.0000 mg | ORAL_TABLET | Freq: Four times a day (QID) | ORAL | 0 refills | Status: DC | PRN

## 2020-03-08 MED ORDER — CYCLOBENZAPRINE HCL 5 MG PO TABS: ORAL_TABLET | ORAL | 0 refills | Status: DC

## 2020-03-08 NOTE — ED Provider Notes (Signed)
[facility] Emergency Department Provider Note  ____________________________________________  Time seen: Approximately 3:24 PM  I have reviewed the triage vital signs and the nursing notes.   HISTORY  Chief Complaint Back Pain    HPI Gerald Ayala is a 39 y.o. male that presents to the emergency department for evaluation of right mid back pain for 2 days.  Pain improves when he lays down and is worse when he sits up.  He is having difficulty standing up due to the pain. Once he starts walking, he does ok walking. He is not having any pain over his spine. Patient denies any trauma.  He has history of sciatica and states that this feels similar. He has a history of a rib fracture to the right side.  He has never had a kidney stone.  No fevers.  No bowel or bladder dysfunction or saddle anesthesias. No IV drug use. No nausea, vomiting, abdominal pain, diarrhea, constipation, urinary symptoms.   History reviewed. No pertinent past medical history.  There are no problems to display for this patient.   History reviewed. No pertinent surgical history.  Prior to Admission medications   Medication Sig Start Date End Date Taking? Authorizing Provider  cyclobenzaprine (FLEXERIL) 5 MG tablet Take 1-2 tablets 3 times daily as needed 03/08/20   Enid Derry, PA-C  famotidine (PEPCID) 20 MG tablet Take 1 tablet (20 mg total) by mouth 2 (two) times daily. 05/16/19   Emily Filbert, MD  ibuprofen (ADVIL) 600 MG tablet Take 1 tablet (600 mg total) by mouth every 6 (six) hours as needed. 03/08/20   Enid Derry, PA-C  lidocaine (LIDODERM) 5 % Place 1 patch onto the skin daily. Remove & Discard patch within 12 hours or as directed by MD 03/08/20   Enid Derry, PA-C  naproxen (NAPROSYN) 500 MG tablet Take 1 tablet (500 mg total) by mouth 2 (two) times daily with a meal. 07/01/19   Jene Every, MD  oxyCODONE-acetaminophen (PERCOCET) 5-325 MG tablet Take 1 tablet by  mouth every 6 (six) hours as needed for up to 3 days for severe pain. 03/08/20 03/11/20  Enid Derry, PA-C    Allergies Patient has no known allergies.  History reviewed. No pertinent family history.  Social History Social History   Tobacco Use  . Smoking status: Current Every Day Smoker    Packs/day: 1.00    Types: Cigarettes  . Smokeless tobacco: Never Used  Substance Use Topics  . Alcohol use: Yes  . Drug use: No     Review of Systems  Constitutional: No fever/chills Cardiovascular: No chest pain. Respiratory:  No SOB. Gastrointestinal: No abdominal pain.  No nausea, no vomiting.  Genitourinary: Negative for dysuria. Musculoskeletal: Positive for back pain. Skin: Negative for rash, abrasions, lacerations, ecchymosis. Neurological: Negative for headaches   ____________________________________________   PHYSICAL EXAM:  VITAL SIGNS: ED Triage Vitals  Enc Vitals Group     BP 03/08/20 1129 103/67     Pulse Rate 03/08/20 1129 69     Resp 03/08/20 1129 18     Temp 03/08/20 1129 98.2 F (36.8 C)     Temp Source 03/08/20 1129 Oral     SpO2 03/08/20 1129 96 %     Weight 03/08/20 1127 205 lb (93 kg)     Height 03/08/20 1127 6\' 2"  (1.88 m)     Head Circumference --      Peak Flow --      Pain Score 03/08/20 1127 10  Pain Loc --      Pain Edu? --      Excl. in GC? --      Constitutional: Alert and oriented. Well appearing and in no acute distress. Eyes: Conjunctivae are normal. PERRL. EOMI. Head: Atraumatic. ENT:      Ears:      Nose: No congestion/rhinnorhea.      Mouth/Throat: Mucous membranes are moist.  Neck: No stridor.  Cardiovascular: Normal rate, regular rhythm.  Good peripheral circulation. Respiratory: Normal respiratory effort without tachypnea or retractions. Lungs CTAB. Good air entry to the bases with no decreased or absent breath sounds. Gastrointestinal: Bowel sounds 4 quadrants. Soft and nontender to palpation. No guarding or rigidity.  No palpable masses. No distention. Musculoskeletal: Full range of motion to all extremities. No gross deformities appreciated.  Tenderness to palpation to right mid back. No tenderness to palpation to thoracic spine or lumbar spine. Strength equal in lower extremities bilaterally. Antalgic gait. Neurologic:  Normal speech and language. No gross focal neurologic deficits are appreciated.  Skin:  Skin is warm, dry and intact. No rash noted. Psychiatric: Mood and affect are normal. Speech and behavior are normal. Patient exhibits appropriate insight and judgement.   ____________________________________________   LABS (all labs ordered are listed, but only abnormal results are displayed)  Labs Reviewed  CBC - Abnormal; Notable for the following components:      Result Value   WBC 13.2 (*)    All other components within normal limits  COMPREHENSIVE METABOLIC PANEL - Abnormal; Notable for the following components:   Potassium 3.4 (*)    All other components within normal limits  URINALYSIS, COMPLETE (UACMP) WITH MICROSCOPIC - Abnormal; Notable for the following components:   Color, Urine YELLOW (*)    APPearance CLEAR (*)    Hgb urine dipstick SMALL (*)    Leukocytes,Ua SMALL (*)    All other components within normal limits  CHLAMYDIA/NGC RT PCR ([facility] ONLY)  URINE CULTURE   ____________________________________________  EKG   ____________________________________________  RADIOLOGY Lexine Baton, personally viewed and evaluated these images (plain radiographs) as part of my medical decision making, as well as reviewing the written report by the radiologist.  Ultrasound  CT Renal Stone Study  Result Date: 03/08/2020 CLINICAL DATA:  Flank pain, kidney stone suspected. EXAM: CT ABDOMEN AND PELVIS WITHOUT CONTRAST TECHNIQUE: Multidetector CT imaging of the abdomen and pelvis was performed following the standard protocol without IV contrast. COMPARISON:  05/16/2019 FINDINGS:  Lower chest: Lung bases are clear. Hepatobiliary: No focal, suspicious hepatic lesion. Stable presumed cyst in the RIGHT hepatic lobe along the margin of the RIGHT hemi liver. Sludge in the gallbladder. No biliary duct distension on noncontrast imaging. Pancreas: Pancreas is unremarkable without ductal dilation or inflammation. Spleen: Spleen normal in size and contour. Adrenals/Urinary Tract: Adrenal glands are normal. Renal contours are smooth. Urinary bladder is under distended, grossly unremarkable. No hydronephrosis or nephrolithiasis.  No ureteral calculi. Stomach/Bowel: Stomach under distended limiting assessment. No acute small bowel process. Normal appendix. No pericolonic stranding. Vascular/Lymphatic: Aorta is of normal caliber. No adenopathy in the retroperitoneum. No adenopathy in the upper abdomen. No pelvic lymphadenopathy. Reproductive: Prostate unremarkable by CT. Other: No ascites. Musculoskeletal: No acute musculoskeletal process. Spinal degenerative changes. Incompletely united RIGHT eleventh rib fracture seen on the study of July 01, 2019. IMPRESSION: No acute findings in the abdomen or pelvis. Specifically, no hydronephrosis or nephrolithiasis. Incompletely united, previously identified RIGHT eleventh rib fracture. Electronically Signed   By: Juliene Pina  Wile M.D.   On: 03/08/2020 14:24    ____________________________________________    PROCEDURES  Procedure(s) performed:    Procedures    Medications  morphine 4 MG/ML injection 4 mg (4 mg Intravenous Given 03/08/20 1341)  ketorolac (TORADOL) 30 MG/ML injection 30 mg (30 mg Intravenous Given 03/08/20 1528)     ____________________________________________   INITIAL IMPRESSION / ASSESSMENT AND PLAN / ED COURSE  Pertinent labs & imaging results that were available during my care of the patient were reviewed by me and considered in my medical decision making (see chart for details).  Review of the [place] CSRS was performed  in accordance of the [facility] prior to dispensing any controlled drugs.   Patient presented to the emergency department for evaluation of right mid back pain for 2 day. Vital signs and exam are reassuring. There is hemoglobin, few WBCs and leukocytes without bacteria on urinalysis and will be sent for culture. Low suspicion for UTI or pyelonephrosis as patient denies any urinary symptoms.Gonorrhea and chlamydia are negative.  No nephrolithiasis on CT scan. Symptoms are consistent with musculoskeletal pain and likely muscle spasm. Pain may be related to his previous rib fracture. Patient was given morphine, percocet, toradol and norflex in the ER for pain.  Patient will be discharged home with prescriptions for mortrin, flexeril, lidoderm, and percocet. Patient is to follow up with PCP as directed. Patient is given ED precautions to return to the ED for any worsening or new symptoms.  Gerald Ayala was evaluated in Emergency Department on 03/09/2020 for the symptoms described in the history of present illness. He was evaluated in the context of the global COVID-19 pandemic, which necessitated consideration that the patient might be at risk for infection with the SARS-CoV-2 virus that causes COVID-19. Institutional protocols and algorithms that pertain to the evaluation of patients at risk for COVID-19 are in a state of rapid change based on information released by regulatory bodies including the CDC and federal and state organizations. These policies and algorithms were followed during the patient's care in the ED.   ____________________________________________  FINAL CLINICAL IMPRESSION(S) / ED DIAGNOSES  Final diagnoses:  Other acute back pain  Muscle spasm of back      NEW MEDICATIONS STARTED DURING THIS VISIT:  ED Discharge Orders         Ordered    ibuprofen (ADVIL) 600 MG tablet  Every 6 hours PRN        03/08/20 1558    cyclobenzaprine (FLEXERIL) 5 MG tablet        03/08/20 1558     lidocaine (LIDODERM) 5 %  Every 24 hours        03/08/20 1558    oxyCODONE-acetaminophen (PERCOCET) 5-325 MG tablet  Every 6 hours PRN        03/08/20 1558              This chart was dictated using voice recognition software/Dragon. Despite best efforts to proofread, errors can occur which can change the meaning. Any change was purely unintentional.    Enid Derry, PA-C 03/09/20 5573    Jene Every, MD 03/11/20 [phone]

## 2020-03-08 NOTE — ED Triage Notes (Signed)
Pt comes POV with right sided back pain. Hx of pinched sciatic nerve and reports feels similar. 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

## 2020-03-08 NOTE — ED Notes (Signed)
Reference triage note. Pt able to ambulate but only while holding onto wheelchair. Pt appears to be in pain during ambulation.

## 2020-03-09 LAB — URINE CULTURE: Culture: <10000 — AB

## 2020-04-05 ENCOUNTER — Emergency Department
Admission: EM | Admit: 2020-04-05 | Discharge: 2020-04-05 | Disposition: A | Discharge status: Home / Self Care | Payer: Self-pay | Attending: Emergency Medicine | Admitting: Emergency Medicine

## 2020-04-05 DIAGNOSIS — R21 Rash and other nonspecific skin eruption: Secondary | ICD-10-CM | POA: Insufficient documentation

## 2020-04-05 DIAGNOSIS — Y9289 Other specified places as the place of occurrence of the external cause: Secondary | ICD-10-CM | POA: Insufficient documentation

## 2020-04-05 DIAGNOSIS — F17210 Nicotine dependence, cigarettes, uncomplicated: Secondary | ICD-10-CM | POA: Insufficient documentation

## 2020-04-05 DIAGNOSIS — W5911XA Bitten by nonvenomous snake, initial encounter: Secondary | ICD-10-CM

## 2020-04-05 DIAGNOSIS — Z23 Encounter for immunization: Secondary | ICD-10-CM | POA: Insufficient documentation

## 2020-04-05 DIAGNOSIS — T63091A Toxic effect of venom of other snake, accidental (unintentional), initial encounter: Secondary | ICD-10-CM | POA: Insufficient documentation

## 2020-04-05 DIAGNOSIS — M2550 Pain in unspecified joint: Secondary | ICD-10-CM | POA: Insufficient documentation

## 2020-04-05 DIAGNOSIS — Y93K1 Activity, walking an animal: Secondary | ICD-10-CM | POA: Insufficient documentation

## 2020-04-05 LAB — CBC
HCT: 44.7 % (ref 39.0–52.0)
Hemoglobin: 15.6 g/dL (ref 13.0–17.0)
MCH: 31.9 pg (ref 26.0–34.0)
MCHC: 34.9 g/dL (ref 30.0–36.0)
MCV: 91.4 fL (ref 80.0–100.0)
Platelets: 319 10*3/uL (ref 150–400)
RBC: 4.89 MIL/uL (ref 4.22–5.81)
RDW: 12.2 % (ref 11.5–15.5)
WBC: 19.2 10*3/uL — ABNORMAL HIGH (ref 4.0–10.5)
nRBC: 0 % (ref 0.0–0.2)

## 2020-04-05 LAB — COMPREHENSIVE METABOLIC PANEL
ALT: 26 U/L (ref 0–44)
AST: 26 U/L (ref 15–41)
Albumin: 4.4 g/dL (ref 3.5–5.0)
Alkaline Phosphatase: 85 U/L (ref 38–126)
Anion gap: 10 (ref 5–15)
BUN: 10 mg/dL (ref 6–20)
CO2: 26 mmol/L (ref 22–32)
Calcium: 9.1 mg/dL (ref 8.9–10.3)
Chloride: 102 mmol/L (ref 98–111)
Creatinine, Ser: 0.72 mg/dL (ref 0.61–1.24)
GFR calc Af Amer: >60 mL/min (ref >60)
GFR calc non Af Amer: >60 mL/min (ref >60)
Glucose, Bld: 100 mg/dL — ABNORMAL HIGH (ref 70–99)
Potassium: 4.3 mmol/L (ref 3.5–5.1)
Sodium: 138 mmol/L (ref 135–145)
Total Bilirubin: 0.7 mg/dL (ref 0.3–1.2)
Total Protein: 7.7 g/dL (ref 6.5–8.1)

## 2020-04-05 LAB — FIBRINOGEN: Fibrinogen: 288 mg/dL (ref 210–475)

## 2020-04-05 MED ORDER — OXYCODONE-ACETAMINOPHEN 5-325 MG PO TABS
1.0000 | ORAL_TABLET | Freq: Four times a day (QID) | ORAL | 0 refills | Status: DC | PRN
Start: 2020-04-05 — End: 2020-10-19

## 2020-04-05 MED ORDER — FENTANYL CITRATE (PF) 100 MCG/2ML IJ SOLN
50.0000 ug | Freq: Once | INTRAMUSCULAR | Status: AC
  Administered 2020-04-05: 50 ug via INTRAVENOUS
  Filled 2020-04-05: qty 2

## 2020-04-05 MED ORDER — OXYCODONE-ACETAMINOPHEN 5-325 MG PO TABS
1.0000 | ORAL_TABLET | ORAL | Status: DC | PRN
  Administered 2020-04-05: 1 via ORAL
  Filled 2020-04-05: qty 1

## 2020-04-05 MED ORDER — OXYCODONE-ACETAMINOPHEN 5-325 MG PO TABS
2.0000 | ORAL_TABLET | Freq: Once | ORAL | Status: AC
  Administered 2020-04-05: 2 via ORAL
  Filled 2020-04-05: qty 2

## 2020-04-05 MED ORDER — TETANUS-DIPHTH-ACELL PERTUSSIS 5-2.5-18.5 LF-MCG/0.5 IM SUSP
0.5000 mL | Freq: Once | INTRAMUSCULAR | Status: AC
  Administered 2020-04-05: 0.5 mL via INTRAMUSCULAR
  Filled 2020-04-05: qty 0.5

## 2020-04-05 MED ORDER — DOXYCYCLINE HYCLATE 100 MG PO CAPS: 100.0000 mg | ORAL_CAPSULE | Freq: Two times a day (BID) | ORAL | 0 refills | Status: AC

## 2020-04-05 NOTE — ED Provider Notes (Signed)
[facility] Emergency Department Provider Note  ____________________________________________   First MD Initiated Contact with Patient 04/05/20 1506     (approximate)  I have reviewed the triage vital signs and the nursing notes.   HISTORY  Chief Complaint Snake Bite    HPI Gerald Ayala is a 39 y.o. male  Here with snake bite, swelling to R foot. Pt was outside walking dog yesterday when he felt a sharp stabbing pain along his medial R foot. He reports he had some swelling at the time and pain. He had been drinking and was able to rest. Today, he showed it to his fiance and mother who told him to come for evaluation. Reports + aching, throbbing, severe pain. Thankfully, the swelling and discoloration has not worsened today. No alleviating factrs. No perioral numbness, CP, SOB, other complaints.        History reviewed. No pertinent past medical history.  There are no problems to display for this patient.   History reviewed. No pertinent surgical history.  Prior to Admission medications   Medication Sig Start Date End Date Taking? Authorizing Provider  cyclobenzaprine (FLEXERIL) 5 MG tablet Take 1-2 tablets 3 times daily as needed 03/08/20   Enid Derry, PA-C  doxycycline (VIBRAMYCIN) 100 MG capsule Take 1 capsule (100 mg total) by mouth 2 (two) times daily for 7 days. 04/05/20 04/12/20  Shaune Pollack, MD  famotidine (PEPCID) 20 MG tablet Take 1 tablet (20 mg total) by mouth 2 (two) times daily. 05/16/19   Emily Filbert, MD  ibuprofen (ADVIL) 600 MG tablet Take 1 tablet (600 mg total) by mouth every 6 (six) hours as needed. 03/08/20   Enid Derry, PA-C  lidocaine (LIDODERM) 5 % Place 1 patch onto the skin daily. Remove & Discard patch within 12 hours or as directed by MD 03/08/20   Enid Derry, PA-C  naproxen (NAPROSYN) 500 MG tablet Take 1 tablet (500 mg total) by mouth 2 (two) times daily with a meal. 07/01/19   Jene Every, MD    oxyCODONE-acetaminophen (PERCOCET) 5-325 MG tablet Take 1-2 tablets by mouth every 6 (six) hours as needed for moderate pain or severe pain. 04/05/20 04/05/21  Shaune Pollack, MD    Allergies Patient has no known allergies.  No family history on file.  Social History Social History   Tobacco Use  . Smoking status: Current Every Day Smoker    Packs/day: 1.00    Types: Cigarettes  . Smokeless tobacco: Never Used  Substance Use Topics  . Alcohol use: Yes  . Drug use: No    Review of Systems  Review of Systems  Constitutional: Negative for chills, fatigue and fever.  HENT: Negative for sore throat.   Respiratory: Negative for shortness of breath.   Cardiovascular: Negative for chest pain.  Gastrointestinal: Negative for abdominal pain.  Genitourinary: Negative for flank pain.  Musculoskeletal: Positive for arthralgias and gait problem. Negative for neck pain.  Skin: Positive for rash and wound.  Allergic/Immunologic: Negative for immunocompromised state.  Neurological: Negative for weakness and numbness.  Hematological: Does not bruise/bleed easily.  All other systems reviewed and are negative.    ____________________________________________  PHYSICAL EXAM:      VITAL SIGNS: ED Triage Vitals  Enc Vitals Group     BP 04/05/20 0937 (!) 135/94     Pulse Rate 04/05/20 0937 (!) 121     Resp 04/05/20 0937 20     Temp --      Temp src --  SpO2 04/05/20 0937 99 %     Weight 04/05/20 0939 210 lb (95.3 kg)     Height 04/05/20 0939 6\' 2"  (1.88 m)     Head Circumference --      Peak Flow --      Pain Score 04/05/20 0939 7     Pain Loc --      Pain Edu? --      Excl. in GC? --      Physical Exam Vitals and nursing note reviewed.  Constitutional:      General: He is not in acute distress.    Appearance: He is well-developed.  HENT:     Head: Normocephalic and atraumatic.  Eyes:     Conjunctiva/sclera: Conjunctivae normal.  Cardiovascular:     Rate and Rhythm:  Normal rate and regular rhythm.     Heart sounds: Normal heart sounds.  Pulmonary:     Effort: Pulmonary effort is normal. No respiratory distress.     Breath sounds: No wheezing.  Abdominal:     General: There is no distension.  Musculoskeletal:     Cervical back: Neck supple.  Skin:    General: Skin is warm.     Capillary Refill: Capillary refill takes less than 2 seconds.     Findings: No rash.  Neurological:     Mental Status: He is alert and oriented to person, place, and time.     Motor: No abnormal muscle tone.      LOWER EXTREMITY EXAM: RIGHT  INSPECTION & PALPATION: Moderate ecchymoses and swelling along medial foot. No blistering. Two small puncture marks approx 1 cm apart. No ulcerations.  SENSORY: sensation is intact to light touch in:  Superficial peroneal nerve distribution (over dorsum of foot) Deep peroneal nerve distribution (over first dorsal web space) Sural nerve distribution (over lateral aspect 5th metatarsal) Saphenous nerve distribution (over medial instep)  MOTOR:  + Motor EHL (great toe dorsiflexion) + FHL (great toe plantar flexion)  + TA (ankle dorsiflexion)  + GSC (ankle plantar flexion)  VASCULAR: 2+ dorsalis pedis and posterior tibialis pulses Capillary refill < 2 sec, toes warm and well-perfused  COMPARTMENTS: Soft, warm, well-perfused No pain with passive extension No parethesias   ____________________________________________   LABS (all labs ordered are listed, but only abnormal results are displayed)  Labs Reviewed  CBC - Abnormal; Notable for the following components:      Result Value   WBC 19.2 (*)    All other components within normal limits  COMPREHENSIVE METABOLIC PANEL - Abnormal; Notable for the following components:   Glucose, Bld 100 (*)    All other components within normal limits  FIBRINOGEN    ____________________________________________  EKG:  None ________________________________________  RADIOLOGY All imaging, including plain films, CT scans, and ultrasounds, independently reviewed by me, and interpretations confirmed via formal radiology reads.  ED MD interpretation:   None  Official radiology report(s): No results found.  ____________________________________________  PROCEDURES   Procedure(s) performed (including Critical Care):  Procedures  ____________________________________________  INITIAL IMPRESSION / MDM / ASSESSMENT AND PLAN / ED COURSE  As part of my medical decision making, I reviewed the following data within the electronic MEDICAL RECORD NUMBER Nursing notes reviewed and incorporated, Old chart reviewed, Notes from prior ED visits, and [place] Controlled Substance Database       *Gerald Ayala was evaluated in Emergency Department on 04/05/2020 for the symptoms described in the history of present illness. He was evaluated in the context of  the global COVID-19 pandemic, which necessitated consideration that the patient might be at risk for infection with the SARS-CoV-2 virus that causes COVID-19. Institutional protocols and algorithms that pertain to the evaluation of patients at risk for COVID-19 are in a state of rapid change based on information released by regulatory bodies including the CDC and federal and state organizations. These policies and algorithms were followed during the patient's care in the ED.  Some ED evaluations and interventions may be delayed as a result of limited staffing during the pandemic.*     Medical Decision Making:  39 yo M here with snake bite.  Bite was last night at around 10 PM.  On exam, he has moderate ecchymosis and swelling but no ulceration, no proximal extension of swelling, no signs of complications.  His pain is severe, but controlled with agents here in the ED.  He is outside of the timeframe and does not meet criteria for antivenom based on improvement in swelling and  no systemic symptoms.  He is hemodynamically stable.  Lab work is reassuring.  He has mild, likely reactive leukocytosis but normal CMP and fibrinogen.  Will DC with analgesics, brief course of antibiotics given open wound from dirty VAC, and refer for outpatient follow-up.  Return precautions given.  ____________________________________________  FINAL CLINICAL IMPRESSION(S) / ED DIAGNOSES  Final diagnoses:  Snake bite, initial encounter     MEDICATIONS GIVEN DURING THIS VISIT:  Medications  oxyCODONE-acetaminophen (PERCOCET/ROXICET) 5-325 MG per tablet 2 tablet (has no administration in time range)  Tdap (BOOSTRIX) injection 0.5 mL (has no administration in time range)  fentaNYL (SUBLIMAZE) injection 50 mcg (50 mcg Intravenous Given 04/05/20 1340)     ED Discharge Orders         Ordered    oxyCODONE-acetaminophen (PERCOCET) 5-325 MG tablet  Every 6 hours PRN        04/05/20 1527    doxycycline (VIBRAMYCIN) 100 MG capsule  2 times daily        04/05/20 1527           Note:  This document was prepared using Dragon voice recognition software and may include unintentional dictation errors.   Shaune Pollack, MD 04/05/20 [phone]

## 2020-04-05 NOTE — ED Notes (Signed)
Addendum: Patient with swelling to top of the foot as well.

## 2020-04-05 NOTE — ED Triage Notes (Signed)
Patient to ED for snake bite to the medial right foot. Patient with swelling, one puncture wound, and slight bruising to the area of the puncture. Patient states it is quite painful. Patient unable to bear weight on foot without increased amount of pain. States it happened sometime last night around 10pm but didn't think too much about it until his mother, a nurse, looked at it and told him it was a snake bite

## 2020-10-19 ENCOUNTER — Other Ambulatory Visit: Payer: Self-pay

## 2020-10-19 ENCOUNTER — Emergency Department
Admission: EM | Admit: 2020-10-19 | Discharge: 2020-10-19 | Disposition: A | Discharge status: Home / Self Care | Payer: Medicaid Other | Attending: Emergency Medicine | Admitting: Emergency Medicine

## 2020-10-19 ENCOUNTER — Emergency Department: Payer: Medicaid Other

## 2020-10-19 DIAGNOSIS — M79672 Pain in left foot: Secondary | ICD-10-CM | POA: Diagnosis present

## 2020-10-19 DIAGNOSIS — F17210 Nicotine dependence, cigarettes, uncomplicated: Secondary | ICD-10-CM | POA: Insufficient documentation

## 2020-10-19 DIAGNOSIS — W11XXXA Fall on and from ladder, initial encounter: Secondary | ICD-10-CM | POA: Insufficient documentation

## 2020-10-19 DIAGNOSIS — M7732 Calcaneal spur, left foot: Secondary | ICD-10-CM | POA: Insufficient documentation

## 2020-10-19 MED ORDER — NAPROXEN 500 MG PO TABS: 500.0000 mg | ORAL_TABLET | Freq: Two times a day (BID) | ORAL | 0 refills | Status: DC

## 2020-10-19 MED ORDER — OXYCODONE-ACETAMINOPHEN 7.5-325 MG PO TABS: 1.0000 | ORAL_TABLET | Freq: Four times a day (QID) | ORAL | 0 refills | Status: DC | PRN

## 2020-10-19 NOTE — Discharge Instructions (Signed)
Follow discharge care instruction.  Advised to purchase over-the-counter heel spur inserts

## 2020-10-19 NOTE — ED Provider Notes (Signed)
[facility] Emergency Department Provider Note   ____________________________________________   None    (approximate)  I have reviewed the triage vital signs and the nursing notes.   HISTORY  Chief Complaint Foot Pain    HPI Gerald Ayala is a 40 y.o. male patient complaint of posterior left heel pain secondary to fall from a ladder 4 days ago.  Patient said he slipped from a ladder and landed on his left heel.  Patient states that since then pain increased with ambulation and standing.  Denies loss of sensation or function.  Rates pain as a 6/10.  Described pain as "achy".  No palliative measure for complaint.         History reviewed. No pertinent past medical history.  There are no problems to display for this patient.   History reviewed. No pertinent surgical history.  Prior to Admission medications   Medication Sig Start Date End Date Taking? Authorizing Provider  naproxen (NAPROSYN) 500 MG tablet Take 1 tablet (500 mg total) by mouth 2 (two) times daily with a meal. 10/19/20  Yes Joni Reining, PA-C  oxyCODONE-acetaminophen (PERCOCET) 7.5-325 MG tablet Take 1 tablet by mouth every 6 (six) hours as needed for severe pain. 10/19/20  Yes Joni Reining, PA-C  famotidine (PEPCID) 20 MG tablet Take 1 tablet (20 mg total) by mouth 2 (two) times daily. 05/16/19   Emily Filbert, MD  ibuprofen (ADVIL) 600 MG tablet Take 1 tablet (600 mg total) by mouth every 6 (six) hours as needed. 03/08/20   Enid Derry, PA-C    Allergies Patient has no known allergies.  No family history on file.  Social History Social History   Tobacco Use  . Smoking status: Current Every Day Smoker    Packs/day: 1.00    Types: Cigarettes  . Smokeless tobacco: Never Used  Substance Use Topics  . Alcohol use: Yes    Comment: occ  . Drug use: Yes    Types: Marijuana    Comment: occ    Review of Systems Constitutional: No fever/chills Eyes: No visual  changes. ENT: No sore throat. Cardiovascular: Denies chest pain. Respiratory: Denies shortness of breath. Gastrointestinal: No abdominal pain.  No nausea, no vomiting.  No diarrhea.  No constipation. Genitourinary: Negative for dysuria. Musculoskeletal: Left heel pain. Skin: Negative for rash. Neurological: Negative for headaches, focal weakness or numbness.   ____________________________________________   PHYSICAL EXAM:  VITAL SIGNS: ED Triage Vitals  Enc Vitals Group     BP 10/19/20 0915 138/83     Pulse Rate 10/19/20 0915 85     Resp 10/19/20 0915 17     Temp 10/19/20 0915 98.1 F (36.7 C)     Temp Source 10/19/20 0915 Oral     SpO2 10/19/20 0915 98 %     Weight 10/19/20 0917 205 lb (93 kg)     Height 10/19/20 0917 6\' 2"  (1.88 m)     Head Circumference --      Peak Flow --      Pain Score 10/19/20 0917 6     Pain Loc --      Pain Edu? --      Excl. in GC? --    Constitutional: Alert and oriented. Well appearing and in no acute distress. Cardiovascular: Normal rate, regular rhythm. Grossly normal heart sounds.  Good peripheral circulation. Respiratory: Normal respiratory effort.  No retractions. Lungs CTAB. Musculoskeletal: No obvious deformity to the left foot/ankle.  Patient is  moderate guarding palpation to the plantar aspect of the left heel. Neurologic:  Normal speech and language. No gross focal neurologic deficits are appreciated. No gait instability. Skin:  Skin is warm, dry and intact. No rash noted. Psychiatric: Mood and affect are normal. Speech and behavior are normal.  ____________________________________________   LABS (all labs ordered are listed, but only abnormal results are displayed)  Labs Reviewed - No data to display ____________________________________________  EKG   ____________________________________________  RADIOLOGY I, Joni Reining, personally viewed and evaluated these images (plain radiographs) as part of my medical decision  making, as well as reviewing the written report by the radiologist.  ED MD interpretation:    Official radiology report(s): DG Ankle Complete Left  Result Date: 10/19/2020 CLINICAL DATA:  Pain following fall EXAM: LEFT ANKLE COMPLETE - 3+ VIEW COMPARISON:  None. FINDINGS: Frontal, oblique, and lateral views were obtained. No fracture or joint effusion. There is no appreciable joint space narrowing or erosion. There are small inferior and posterior calcaneal spurs. Ankle mortise appears intact. IMPRESSION: No evident fracture or appreciable arthropathy. Ankle mortise appears intact. There are calcaneal spurs. Electronically Signed   By: Bretta Bang III M.D.   On: 10/19/2020 10:52    ____________________________________________   PROCEDURES  Procedure(s) performed (including Critical Care):  Procedures   ____________________________________________   INITIAL IMPRESSION / ASSESSMENT AND PLAN / ED COURSE  As part of my medical decision making, I reviewed the following data within the electronic MEDICAL RECORD NUMBER         Patient presents with posterior heel pain secondary to fall from a ladder.  Differential consist of contusion, heel fracture, or heel spur.  Discussed x-ray findings with patient.  Patient given discharge care instruction advised to purchase over-the-counter heel spur supports pending evaluation by orthopedics.  Take medication as directed.      ____________________________________________   FINAL CLINICAL IMPRESSION(S) / ED DIAGNOSES  Final diagnoses:  Heel spur, left     ED Discharge Orders         Ordered    naproxen (NAPROSYN) 500 MG tablet  2 times daily with meals        10/19/20 1102    oxyCODONE-acetaminophen (PERCOCET) 7.5-325 MG tablet  Every 6 hours PRN        10/19/20 1102          *Please note:  Gerald Ayala was evaluated in Emergency Department on 10/19/2020 for the symptoms described in the history of present illness. He was  evaluated in the context of the global COVID-19 pandemic, which necessitated consideration that the patient might be at risk for infection with the SARS-CoV-2 virus that causes COVID-19. Institutional protocols and algorithms that pertain to the evaluation of patients at risk for COVID-19 are in a state of rapid change based on information released by regulatory bodies including the CDC and federal and state organizations. These policies and algorithms were followed during the patient's care in the ED.  Some ED evaluations and interventions may be delayed as a result of limited staffing during and the pandemic.*   Note:  This document was prepared using Dragon voice recognition software and may include unintentional dictation errors.    Joni Reining, PA-C 10/19/20 1106    Chesley Noon, MD 10/20/20 0700

## 2020-10-19 NOTE — ED Triage Notes (Signed)
Pt comes with c/o left foot and ankle pain. Pt states it hurts more on the bottom and towards his heel. Pt states he stepped wrong off a ladder few days ago.

## 2020-10-19 NOTE — ED Notes (Signed)
See triage note  Presents with pain to left foot  States he missed a step last week while on a ladder  Having pain to left ankle/heel  States pain with some swelling   Unable to bear wt

## 2022-02-04 IMAGING — CT CT RENAL STONE PROTOCOL
2 of 4 series · 16 of 46 positions shown, 18 images · non-contrast
Comparison: 05/16/2019

CLINICAL DATA: Flank pain, kidney stone suspected.

EXAM:
CT ABDOMEN AND PELVIS WITHOUT CONTRAST
TECHNIQUE: Multidetector CT imaging of the abdomen and pelvis was performed
following the standard protocol without IV contrast.

[Series 2: stone full standard · axial · 0.83mm/px · z∈[-606,-146]mm · 13 of 102 slices shown, 15 images]
[im 5/102  soft-tissue]
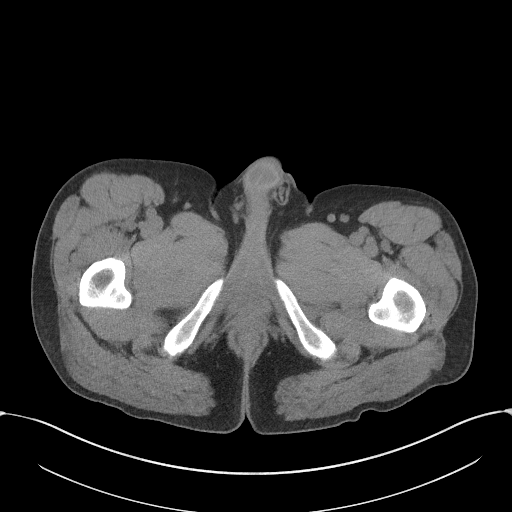
[im 5/102  bone]
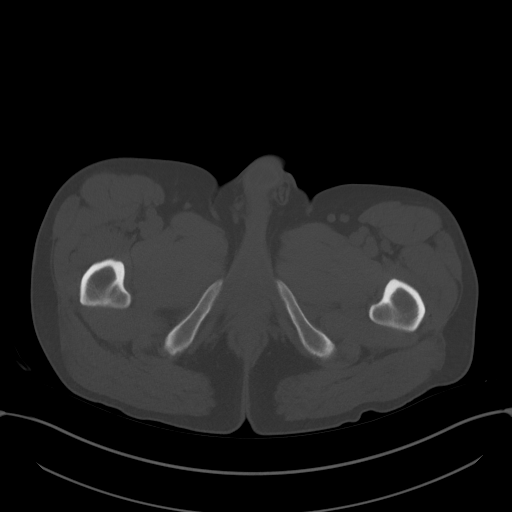
[im 13/102  soft-tissue]
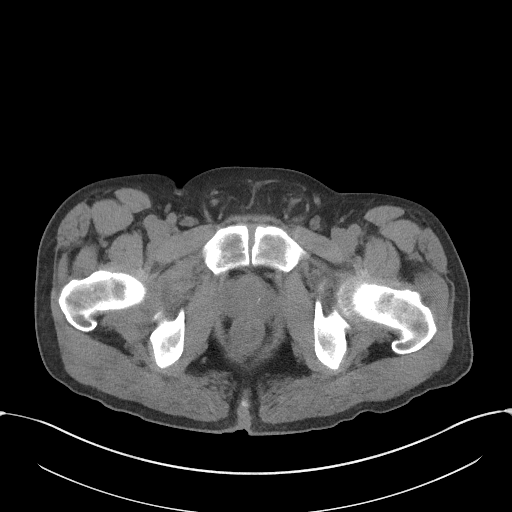
[im 22/102  soft-tissue]
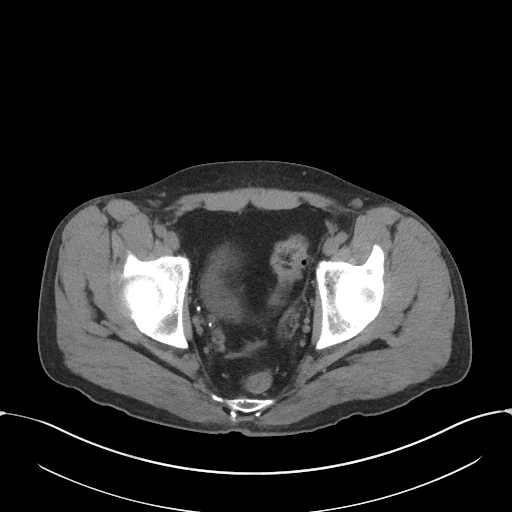
[im 30/102  soft-tissue]
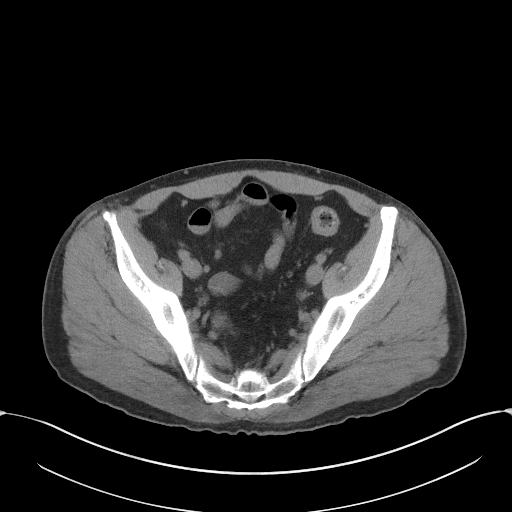
[im 34/102  soft-tissue]
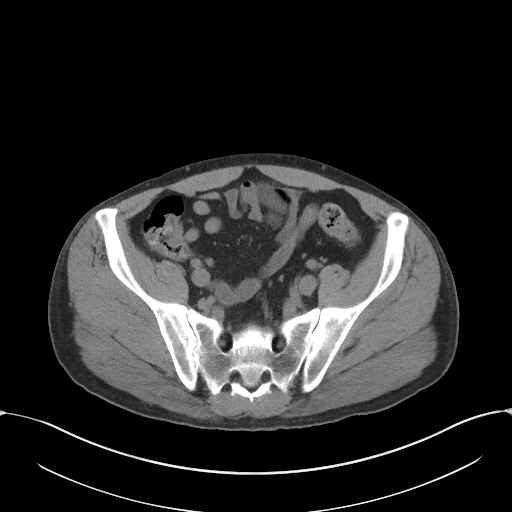
[im 43/102  soft-tissue]
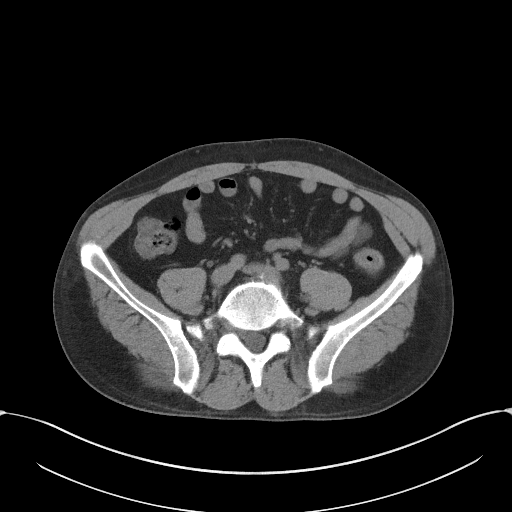
[im 51/102  soft-tissue]
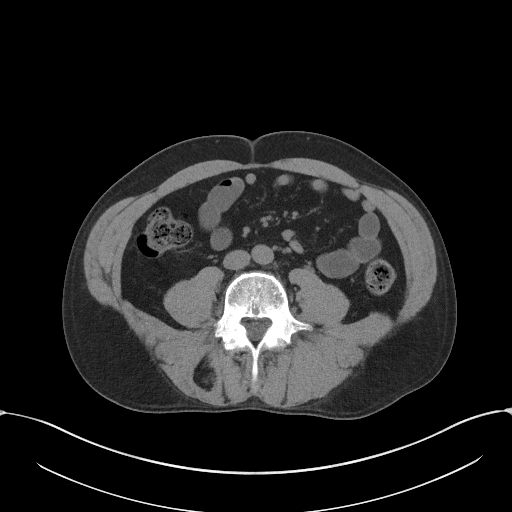
[im 59/102  soft-tissue]
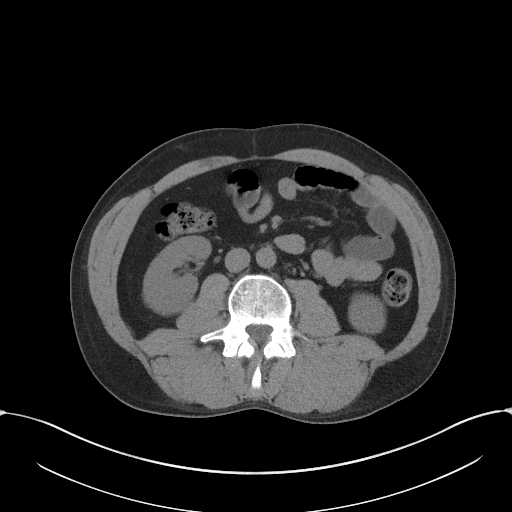
[im 68/102  soft-tissue]
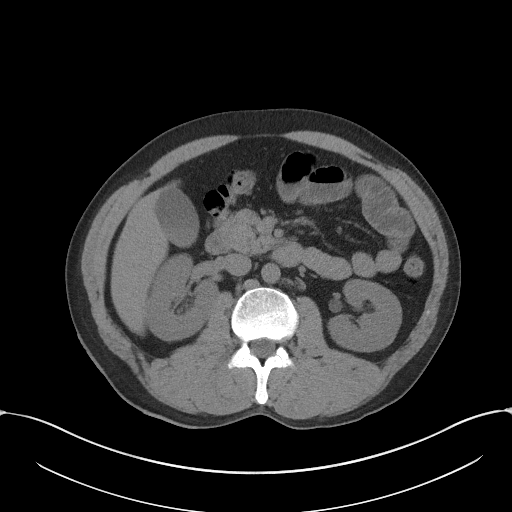
[im 68/102  bone]
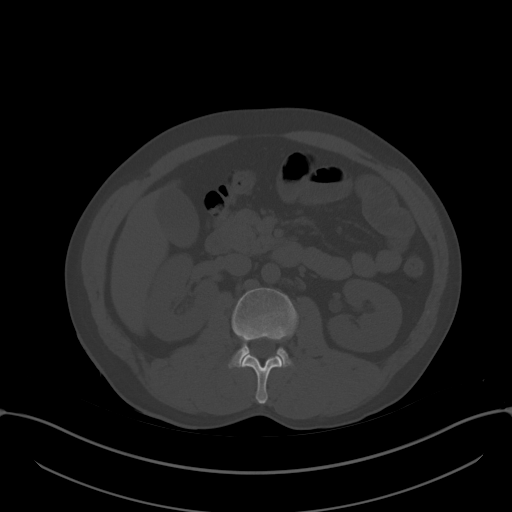
[im 72/102  soft-tissue]
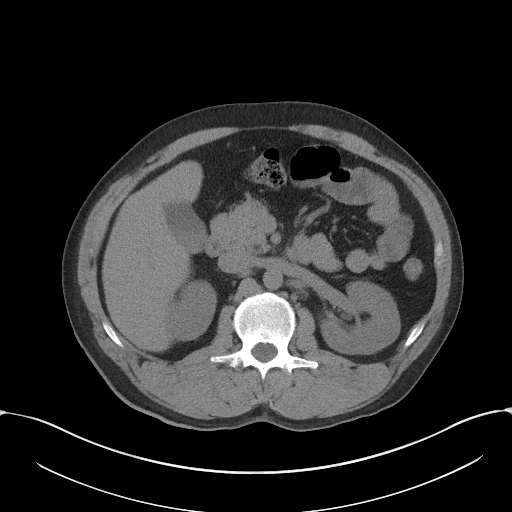
[im 80/102  soft-tissue]
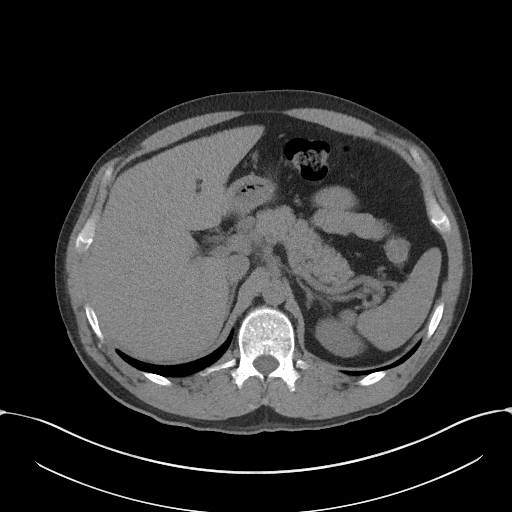
[im 89/102  soft-tissue]
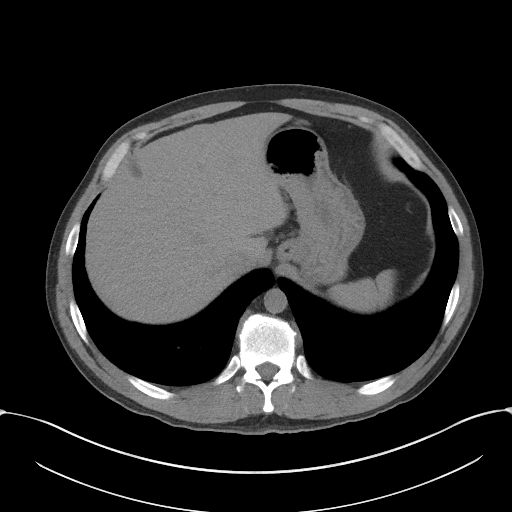
[im 97/102  soft-tissue]
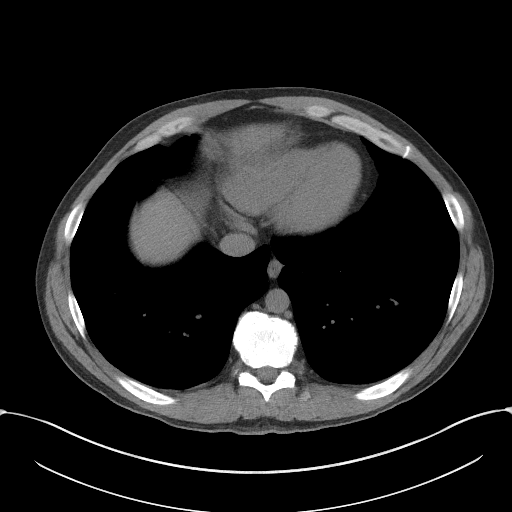

[Series 5: coronal · coronal · 0.76mm/px · 3 of 151 slices shown]
[im 51/151  soft-tissue]
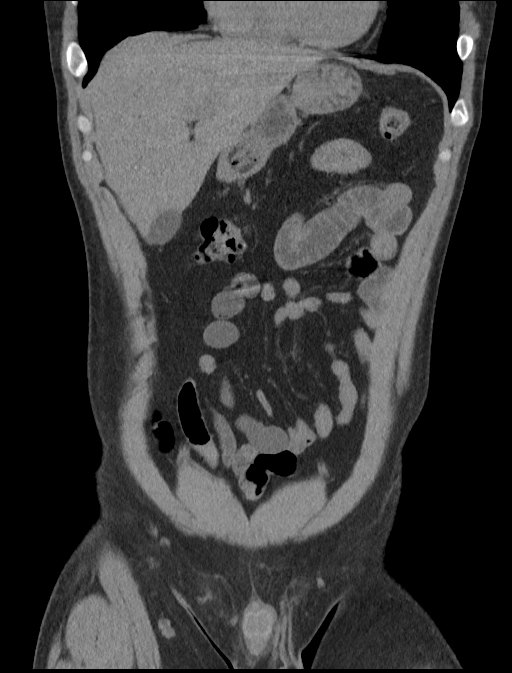
[im 67/151  soft-tissue]
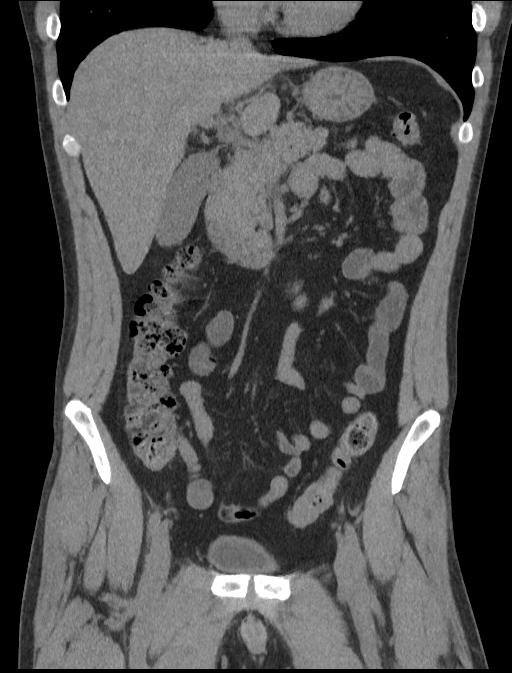
[im 84/151  soft-tissue]
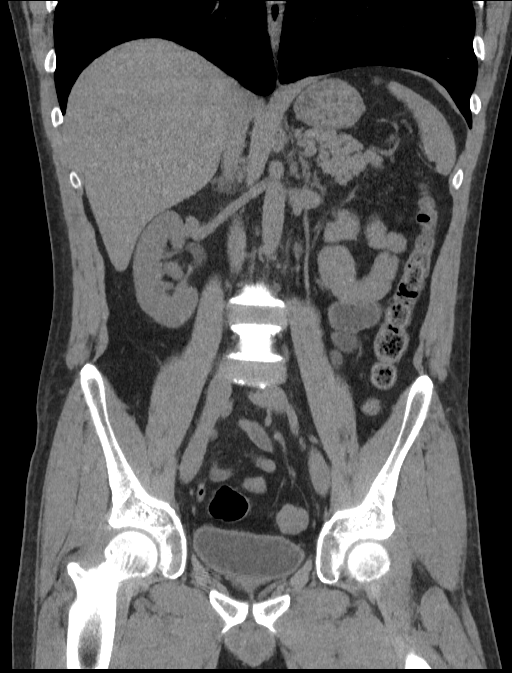

[16 of 46 positions shown; findings below may reference images not displayed]

FINDINGS: Lower chest: Lung bases are clear.

Hepatobiliary: No focal, suspicious hepatic lesion. Stable presumed
cyst in the RIGHT hepatic lobe along the margin of the RIGHT hemi
liver. Sludge in the gallbladder. No biliary duct distension on
noncontrast imaging.

Pancreas: Pancreas is unremarkable without ductal dilation or
inflammation.

Spleen: Spleen normal in size and contour.

Adrenals/Urinary Tract: Adrenal glands are normal.

Renal contours are smooth. Urinary bladder is under distended,
grossly unremarkable.

No hydronephrosis or nephrolithiasis.  No ureteral calculi.

Stomach/Bowel: Stomach under distended limiting assessment. No acute
small bowel process. Normal appendix. No pericolonic stranding.

Vascular/Lymphatic: Aorta is of normal caliber. No adenopathy in the
retroperitoneum. No adenopathy in the upper abdomen.

No pelvic lymphadenopathy.

Reproductive: Prostate unremarkable by CT.

Other: No ascites.

Musculoskeletal: No acute musculoskeletal process. Spinal
degenerative changes.

Incompletely united RIGHT eleventh rib fracture seen on the study July 01, 2019.
IMPRESSION: No acute findings in the abdomen or pelvis. Specifically, no
hydronephrosis or nephrolithiasis.

Incompletely united, previously identified RIGHT eleventh rib
fracture.

## 2023-08-13 ENCOUNTER — Encounter: Payer: Self-pay | Admitting: Emergency Medicine

## 2023-08-13 ENCOUNTER — Other Ambulatory Visit: Payer: Self-pay

## 2023-08-13 ENCOUNTER — Emergency Department: Payer: Medicaid Other

## 2023-08-13 ENCOUNTER — Emergency Department
Admission: EM | Admit: 2023-08-13 | Discharge: 2023-08-13 | Disposition: A | Discharge status: Home / Self Care | Payer: Medicaid Other | Attending: Emergency Medicine | Admitting: Emergency Medicine

## 2023-08-13 DIAGNOSIS — M25532 Pain in left wrist: Secondary | ICD-10-CM | POA: Diagnosis present

## 2023-08-13 NOTE — ED Provider Notes (Signed)
[facility] Provider Note    Event Date/Time   First MD Initiated Contact with Patient 08/13/23 [phone]     (approximate)   History   Wrist Pain   HPI REZNOR FERRANDO is a 43 y.o. 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  Small cut and bruising noted to the lateral aspect of his distal forearm.  Ongoing pain symptoms overnight and wanted to make sure nothing was broken.  Denies injury elsewhere.  No numbness or tingling.  States he has had a tetanus shot within the last 5 years.  Chart reviewed with tetanus shot in 2021.     Physical Exam   Triage Vital Signs: ED Triage Vitals  Encounter Vitals Group     BP 08/13/23 0913 (!) 139/90     Systolic BP Percentile --      Diastolic BP Percentile --      Pulse Rate 08/13/23 0913 100     Resp 08/13/23 0913 18     Temp 08/13/23 0913 97.6 F (36.4 C)     Temp Source 08/13/23 0913 Oral     SpO2 08/13/23 0913 100 %     Weight 08/13/23 0907 205 lb (93 kg)     Height 08/13/23 0907 6\' 2"  (1.88 m)     Head Circumference --      Peak Flow --      Pain Score 08/13/23 0907 3     Pain Loc --      Pain Education --      Exclude from Growth Chart --     Most recent vital signs: Vitals:   08/13/23 0913  BP: (!) 139/90  Pulse: 100  Resp: 18  Temp: 97.6 F (36.4 C)  SpO2: 100%   I have reviewed the vital signs. General:  Awake, alert, no acute distress. Head:  Normocephalic, Atraumatic. EENT:  PERRL, EOMI, Oral mucosa pink and moist, Neck is supple. Cardiovascular: Regular rate, 2+ distal pulses. Respiratory:  Normal respiratory effort, symmetrical expansion, no distress.   Extremities: Hematoma to distal left lateral forearm with tenderness to palpation from mid forearm to distal.  No obvious deformity.  Small well-healed cut over the hematoma. Neuro:  Alert and oriented.  Interacting appropriately.   Skin:  Warm,  dry, no rash.   Psych: Appropriate affect.    ED Results / Procedures / Treatments   Labs (all labs ordered are listed, but only abnormal results are displayed) Labs Reviewed - No data to display   EKG    RADIOLOGY Independently interpreted x-ray with no acute pathology   PROCEDURES:  Critical Care performed: No  Procedures   MEDICATIONS ORDERED IN ED: Medications - No data to display   IMPRESSION / MDM / ASSESSMENT AND PLAN / ED COURSE  I reviewed the triage vital signs and the nursing notes.                              Differential diagnosis includes, but is not limited to, radius/ulna fracture, soft tissue hematoma, low suspicion for cellulitis  Patient's presentation is most consistent with acute complicated illness / injury requiring diagnostic workup.  Patient is a 43 year old male presenting today for left wrist pain following traumatic injury yesterday.  X-ray shows no evidence of a fracture.  Neurovascularly intact throughout.  Suspect a soft tissue  hematoma at this point.  Given strict return precautions for any worsening symptoms such as concern for cellulitis or infection which are not present at this time.     FINAL CLINICAL IMPRESSION(S) / ED DIAGNOSES   Final diagnoses:  Left wrist pain     Rx / DC Orders   ED Discharge Orders     None        Note:  This document was prepared using Dragon voice recognition software and may include unintentional dictation errors.   Janith Lima, MD 08/13/23 1028

## 2023-08-13 NOTE — Discharge Instructions (Signed)
No evidence of a fracture on x-ray.  You can use Tylenol and Motrin as needed to the site.  Please keep an eye out for worsening redness around the forearm or any pus drainage from the site as you may need to be seen again to be placed on antibiotics.

## 2023-08-13 NOTE — ED Triage Notes (Signed)
Pt via POV from home. 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 Swelling and redness to the L wrist. Pt is A&Ox4 and NAD
